# Patient Record
Sex: Female | Born: 1965 | Race: White | Hispanic: No | Marital: Single | Smoking: Current every day smoker
Health system: Southern US, Community
[De-identification: ages and names within clinical notes are randomized; demographics above are authoritative.]

## PROBLEM LIST (undated history)

## (undated) DIAGNOSIS — R000 Tachycardia, unspecified: Secondary | ICD-10-CM

## (undated) DIAGNOSIS — I10 Essential (primary) hypertension: Secondary | ICD-10-CM

## (undated) DIAGNOSIS — G459 Transient cerebral ischemic attack, unspecified: Secondary | ICD-10-CM

## (undated) DIAGNOSIS — F1010 Alcohol abuse, uncomplicated: Secondary | ICD-10-CM

## (undated) DIAGNOSIS — R748 Abnormal levels of other serum enzymes: Secondary | ICD-10-CM

## (undated) DIAGNOSIS — R569 Unspecified convulsions: Secondary | ICD-10-CM

## (undated) DIAGNOSIS — F419 Anxiety disorder, unspecified: Secondary | ICD-10-CM

## (undated) HISTORY — DX: Tachycardia, unspecified: R00.0

## (undated) HISTORY — DX: Abnormal levels of other serum enzymes: R74.8

## (undated) HISTORY — PX: ANTERIOR CRUCIATE LIGAMENT REPAIR: SHX115

---

## 1999-06-12 ENCOUNTER — Other Ambulatory Visit: Admission: RE | Admit: 1999-06-12 | Discharge: 1999-06-12 | Payer: Self-pay | Admitting: *Deleted

## 1999-09-24 ENCOUNTER — Other Ambulatory Visit: Admission: RE | Admit: 1999-09-24 | Discharge: 1999-09-24 | Payer: Self-pay | Admitting: *Deleted

## 2017-02-22 ENCOUNTER — Inpatient Hospital Stay (HOSPITAL_COMMUNITY)
Admission: EM | Admit: 2017-02-22 | Discharge: 2017-02-28 | DRG: 897 | Disposition: A | Discharge status: Home / Self Care | Payer: BLUE CROSS/BLUE SHIELD | Attending: Internal Medicine | Admitting: Internal Medicine

## 2017-02-22 ENCOUNTER — Encounter (HOSPITAL_COMMUNITY): Payer: Self-pay

## 2017-02-22 DIAGNOSIS — Z781 Physical restraint status: Secondary | ICD-10-CM | POA: Diagnosis not present

## 2017-02-22 DIAGNOSIS — F329 Major depressive disorder, single episode, unspecified: Secondary | ICD-10-CM | POA: Diagnosis present

## 2017-02-22 DIAGNOSIS — E876 Hypokalemia: Secondary | ICD-10-CM | POA: Diagnosis present

## 2017-02-22 DIAGNOSIS — Z9181 History of falling: Secondary | ICD-10-CM

## 2017-02-22 DIAGNOSIS — D72819 Decreased white blood cell count, unspecified: Secondary | ICD-10-CM | POA: Diagnosis not present

## 2017-02-22 DIAGNOSIS — G4089 Other seizures: Secondary | ICD-10-CM | POA: Diagnosis present

## 2017-02-22 DIAGNOSIS — R569 Unspecified convulsions: Secondary | ICD-10-CM | POA: Diagnosis not present

## 2017-02-22 DIAGNOSIS — D696 Thrombocytopenia, unspecified: Secondary | ICD-10-CM | POA: Diagnosis present

## 2017-02-22 DIAGNOSIS — Z885 Allergy status to narcotic agent status: Secondary | ICD-10-CM

## 2017-02-22 DIAGNOSIS — N179 Acute kidney failure, unspecified: Secondary | ICD-10-CM | POA: Diagnosis present

## 2017-02-22 DIAGNOSIS — I10 Essential (primary) hypertension: Secondary | ICD-10-CM | POA: Diagnosis present

## 2017-02-22 DIAGNOSIS — F1020 Alcohol dependence, uncomplicated: Secondary | ICD-10-CM | POA: Diagnosis present

## 2017-02-22 DIAGNOSIS — F419 Anxiety disorder, unspecified: Secondary | ICD-10-CM | POA: Diagnosis present

## 2017-02-22 DIAGNOSIS — K7010 Alcoholic hepatitis without ascites: Secondary | ICD-10-CM | POA: Diagnosis not present

## 2017-02-22 DIAGNOSIS — F10939 Alcohol use, unspecified with withdrawal, unspecified: Secondary | ICD-10-CM | POA: Diagnosis present

## 2017-02-22 DIAGNOSIS — F17200 Nicotine dependence, unspecified, uncomplicated: Secondary | ICD-10-CM | POA: Diagnosis present

## 2017-02-22 DIAGNOSIS — F4310 Post-traumatic stress disorder, unspecified: Secondary | ICD-10-CM | POA: Diagnosis present

## 2017-02-22 DIAGNOSIS — F10239 Alcohol dependence with withdrawal, unspecified: Secondary | ICD-10-CM | POA: Diagnosis not present

## 2017-02-22 DIAGNOSIS — F17210 Nicotine dependence, cigarettes, uncomplicated: Secondary | ICD-10-CM | POA: Diagnosis present

## 2017-02-22 HISTORY — DX: Anxiety disorder, unspecified: F41.9

## 2017-02-22 HISTORY — DX: Essential (primary) hypertension: I10

## 2017-02-22 LAB — COMPREHENSIVE METABOLIC PANEL
ALBUMIN: 3.5 g/dL (ref 3.5–5.0)
ALT: 107 U/L — ABNORMAL HIGH (ref 14–54)
ANION GAP: 25 — AB (ref 5–15)
AST: 257 U/L — ABNORMAL HIGH (ref 15–41)
Alkaline Phosphatase: 121 U/L (ref 38–126)
BUN: <5 mg/dL — ABNORMAL LOW (ref 6–20)
CO2: 19 mmol/L — AB (ref 22–32)
Calcium: 10.8 mg/dL — ABNORMAL HIGH (ref 8.9–10.3)
Chloride: 94 mmol/L — ABNORMAL LOW (ref 101–111)
Creatinine, Ser: 1.07 mg/dL — ABNORMAL HIGH (ref 0.44–1.00)
GFR calc Af Amer: >60 mL/min (ref >60)
GFR calc non Af Amer: >60 mL/min (ref >60)
GLUCOSE: 194 mg/dL — AB (ref 65–99)
POTASSIUM: 2.7 mmol/L — AB (ref 3.5–5.1)
SODIUM: 138 mmol/L (ref 135–145)
TOTAL PROTEIN: 6.9 g/dL (ref 6.5–8.1)
Total Bilirubin: 3 mg/dL — ABNORMAL HIGH (ref 0.3–1.2)

## 2017-02-22 LAB — CBC WITH DIFFERENTIAL/PLATELET
BASOS ABS: 0 10*3/uL (ref 0.0–0.1)
Basophils Relative: 1 %
Eosinophils Absolute: 0 10*3/uL (ref 0.0–0.7)
Eosinophils Relative: 0 %
HEMATOCRIT: 45.1 % (ref 36.0–46.0)
Hemoglobin: 15.8 g/dL — ABNORMAL HIGH (ref 12.0–15.0)
LYMPHS PCT: 17 %
Lymphs Abs: 0.7 10*3/uL (ref 0.7–4.0)
MCH: 35.8 pg — ABNORMAL HIGH (ref 26.0–34.0)
MCHC: 35 g/dL (ref 30.0–36.0)
MCV: 102.3 fL — AB (ref 78.0–100.0)
MONO ABS: 0.5 10*3/uL (ref 0.1–1.0)
MONOS PCT: 13 %
NEUTROS ABS: 2.6 10*3/uL (ref 1.7–7.7)
Neutrophils Relative %: 69 %
Platelets: 108 10*3/uL — ABNORMAL LOW (ref 150–400)
RBC: 4.41 MIL/uL (ref 3.87–5.11)
RDW: 16.1 % — AB (ref 11.5–15.5)
WBC: 3.9 10*3/uL — ABNORMAL LOW (ref 4.0–10.5)

## 2017-02-22 LAB — IRON AND TIBC
IRON: 201 ug/dL — AB (ref 28–170)
Saturation Ratios: 71 % — ABNORMAL HIGH (ref 10.4–31.8)
TIBC: 283 ug/dL (ref 250–450)
UIBC: 82 ug/dL

## 2017-02-22 LAB — URINALYSIS, ROUTINE W REFLEX MICROSCOPIC
Glucose, UA: 50 mg/dL — AB
Ketones, ur: NEGATIVE mg/dL
LEUKOCYTES UA: NEGATIVE
Nitrite: NEGATIVE
Protein, ur: 100 mg/dL — AB
RBC / HPF: NONE SEEN RBC/hpf (ref 0–5)
SPECIFIC GRAVITY, URINE: 1.018 (ref 1.005–1.030)
pH: 5 (ref 5.0–8.0)

## 2017-02-22 LAB — RETICULOCYTES
RBC.: 3.85 MIL/uL — ABNORMAL LOW (ref 3.87–5.11)
RETIC COUNT ABSOLUTE: 23.1 10*3/uL (ref 19.0–186.0)
Retic Ct Pct: 0.6 % (ref 0.4–3.1)

## 2017-02-22 LAB — RAPID URINE DRUG SCREEN, HOSP PERFORMED
AMPHETAMINES: NOT DETECTED
BARBITURATES: NOT DETECTED
Benzodiazepines: POSITIVE — AB
COCAINE: NOT DETECTED
Opiates: NOT DETECTED
TETRAHYDROCANNABINOL: NOT DETECTED

## 2017-02-22 LAB — PROTIME-INR
INR: 1.17
Prothrombin Time: 14.9 seconds (ref 11.4–15.2)

## 2017-02-22 LAB — ETHANOL: Alcohol, Ethyl (B): 12 mg/dL — ABNORMAL HIGH (ref <5)

## 2017-02-22 LAB — MAGNESIUM: MAGNESIUM: 1 mg/dL — AB (ref 1.7–2.4)

## 2017-02-22 LAB — FERRITIN: FERRITIN: 2777 ng/mL — AB (ref 11–307)

## 2017-02-22 LAB — VITAMIN B12: VITAMIN B 12: 329 pg/mL (ref 180–914)

## 2017-02-22 MED ORDER — LORAZEPAM 1 MG PO TABS: 1.0000 mg | ORAL_TABLET | Freq: Four times a day (QID) | ORAL | Status: DC | PRN

## 2017-02-22 MED ORDER — ADULT MULTIVITAMIN W/MINERALS CH
1.0000 | ORAL_TABLET | Freq: Every day | ORAL | Status: DC
  Administered 2017-02-23 – 2017-02-28 (×5): 1 via ORAL
  Filled 2017-02-22 (×5): qty 1

## 2017-02-22 MED ORDER — FOLIC ACID 1 MG PO TABS
1.0000 mg | ORAL_TABLET | Freq: Every day | ORAL | Status: DC
  Administered 2017-02-22: 1 mg via ORAL
  Filled 2017-02-22: qty 1

## 2017-02-22 MED ORDER — ONDANSETRON HCL 4 MG/2ML IJ SOLN: 4.0000 mg | Freq: Four times a day (QID) | INTRAMUSCULAR | Status: DC | PRN

## 2017-02-22 MED ORDER — SODIUM CHLORIDE 0.9% FLUSH
3.0000 mL | Freq: Two times a day (BID) | INTRAVENOUS | Status: DC
  Administered 2017-02-22 – 2017-02-28 (×9): 3 mL via INTRAVENOUS

## 2017-02-22 MED ORDER — ENOXAPARIN SODIUM 40 MG/0.4ML ~~LOC~~ SOLN
40.0000 mg | Freq: Every day | SUBCUTANEOUS | Status: DC
  Administered 2017-02-22: 40 mg via SUBCUTANEOUS
  Filled 2017-02-22: qty 0.4

## 2017-02-22 MED ORDER — NICOTINE 7 MG/24HR TD PT24
7.0000 mg | MEDICATED_PATCH | Freq: Every day | TRANSDERMAL | Status: DC
  Administered 2017-02-22: 7 mg via TRANSDERMAL
  Filled 2017-02-22: qty 1

## 2017-02-22 MED ORDER — SODIUM CHLORIDE 0.9 % IV SOLN
30.0000 meq | Freq: Once | INTRAVENOUS | Status: DC
  Filled 2017-02-22: qty 15

## 2017-02-22 MED ORDER — MAGNESIUM SULFATE 2 GM/50ML IV SOLN
2.0000 g | Freq: Once | INTRAVENOUS | Status: AC
  Administered 2017-02-22: 2 g via INTRAVENOUS
  Filled 2017-02-22: qty 50

## 2017-02-22 MED ORDER — FAMOTIDINE IN NACL 20-0.9 MG/50ML-% IV SOLN
20.0000 mg | Freq: Two times a day (BID) | INTRAVENOUS | Status: DC
  Administered 2017-02-22 – 2017-02-23 (×2): 20 mg via INTRAVENOUS
  Filled 2017-02-22 (×2): qty 50

## 2017-02-22 MED ORDER — ADULT MULTIVITAMIN W/MINERALS CH
1.0000 | ORAL_TABLET | Freq: Every day | ORAL | Status: DC
  Administered 2017-02-22: 1 via ORAL
  Filled 2017-02-22: qty 1

## 2017-02-22 MED ORDER — FOLIC ACID 1 MG PO TABS
1.0000 mg | ORAL_TABLET | Freq: Every day | ORAL | Status: DC
  Administered 2017-02-23 – 2017-02-28 (×5): 1 mg via ORAL
  Filled 2017-02-22 (×7): qty 1

## 2017-02-22 MED ORDER — THIAMINE HCL 100 MG/ML IJ SOLN: 100.0000 mg | Freq: Every day | INTRAMUSCULAR | Status: DC

## 2017-02-22 MED ORDER — NICOTINE 7 MG/24HR TD PT24
7.0000 mg | MEDICATED_PATCH | Freq: Every day | TRANSDERMAL | Status: DC
  Administered 2017-02-22 – 2017-02-23 (×2): 7 mg via TRANSDERMAL
  Filled 2017-02-22: qty 1

## 2017-02-22 MED ORDER — LORAZEPAM 2 MG/ML IJ SOLN
2.0000 mg | INTRAMUSCULAR | Status: DC | PRN
Start: 2017-02-22 — End: 2017-02-28
  Administered 2017-02-22 – 2017-02-23 (×2): 2 mg via INTRAVENOUS
  Administered 2017-02-23: 3 mg via INTRAVENOUS
  Administered 2017-02-23 – 2017-02-24 (×5): 2 mg via INTRAVENOUS
  Administered 2017-02-24 (×2): 3 mg via INTRAVENOUS
  Administered 2017-02-24 – 2017-02-25 (×2): 2 mg via INTRAVENOUS
  Filled 2017-02-22 (×5): qty 1
  Filled 2017-02-22: qty 2
  Filled 2017-02-22 (×8): qty 1
  Filled 2017-02-22: qty 2
  Filled 2017-02-22: qty 1

## 2017-02-22 MED ORDER — VITAMIN B-1 100 MG PO TABS
100.0000 mg | ORAL_TABLET | Freq: Every day | ORAL | Status: DC
  Administered 2017-02-22: 100 mg via ORAL
  Filled 2017-02-22: qty 1

## 2017-02-22 MED ORDER — THIAMINE HCL 100 MG/ML IJ SOLN
Freq: Once | INTRAVENOUS | Status: AC
  Administered 2017-02-22: 20:00:00 via INTRAVENOUS
  Filled 2017-02-22: qty 1000

## 2017-02-22 MED ORDER — MAGNESIUM SULFATE 2 GM/50ML IV SOLN
2.0000 g | Freq: Once | INTRAVENOUS | Status: AC
  Administered 2017-02-23: 2 g via INTRAVENOUS
  Filled 2017-02-22: qty 50

## 2017-02-22 MED ORDER — SODIUM CHLORIDE 0.9 % IV SOLN
INTRAVENOUS | Status: DC
  Administered 2017-02-23: 04:00:00 via INTRAVENOUS

## 2017-02-22 MED ORDER — SODIUM CHLORIDE 0.9 % IV BOLUS (SEPSIS)
500.0000 mL | Freq: Once | INTRAVENOUS | Status: AC
  Administered 2017-02-22: 500 mL via INTRAVENOUS

## 2017-02-22 MED ORDER — LORAZEPAM 2 MG/ML IJ SOLN: 1.0000 mg | Freq: Four times a day (QID) | INTRAMUSCULAR | Status: DC | PRN

## 2017-02-22 MED ORDER — LORAZEPAM 2 MG/ML IJ SOLN
1.0000 mg | Freq: Once | INTRAMUSCULAR | Status: AC
  Administered 2017-02-22: 1 mg via INTRAVENOUS
  Filled 2017-02-22: qty 1

## 2017-02-22 MED ORDER — POTASSIUM CHLORIDE CRYS ER 20 MEQ PO TBCR
20.0000 meq | EXTENDED_RELEASE_TABLET | Freq: Once | ORAL | Status: AC
  Administered 2017-02-22: 20 meq via ORAL
  Filled 2017-02-22: qty 1

## 2017-02-22 MED ORDER — BISACODYL 5 MG PO TBEC: 5.0000 mg | DELAYED_RELEASE_TABLET | Freq: Every day | ORAL | Status: DC | PRN

## 2017-02-22 MED ORDER — POLYETHYLENE GLYCOL 3350 17 G PO PACK: 17.0000 g | PACK | Freq: Every day | ORAL | Status: DC | PRN

## 2017-02-22 NOTE — ED Notes (Signed)
Attempted report at this time.  Nurse to call back when available. 

## 2017-02-22 NOTE — ED Provider Notes (Signed)
[facility] DEPT Provider Note   CSN: [id number] Arrival date & time: 02/22/17  1553     History   Chief Complaint Chief Complaint  Patient presents with  . Seizures    HPI Cynthia Schroeder is a 51 y.o. female.  HPI   She presents for evaluation of a seizure.  She reports having a seizure in the remote past but is not currently taking medications for seizure disorder.  She recently moved to [facility] to be with her mother who had surgery.  She also went through rehab for alcohol abuse, a 28 day program, 3 months ago.  Since discharge from the rehab, 2 months ago she has been drinking daily "2 or 3 airplane bottles".  In the last several days she has had some nausea and vomiting and decreased ability to eat and drink.  Her mother thought that she had the flu.  The patient was shopping in a grocery store today, when workers there saw her have a "seizure".  They called EMS.  EMS found the patient to be alert, but confused, then after about 5 minutes she became alert and oriented 3.  She was then transported to the hospital and reportedly had a "grand mal seizure" with subsequent postictal state, during the transport.  Duration of the seizure is unknown.  On arrival the patient is alert, calm and comfortable has no complaints.  She denies headache neck pain back pain nausea vomiting weakness or dizziness.  Her mother is here now with her.  The patient had been shopping alone.  There have been no recent illnesses such as fever, cough, chest pain, change in bowel or urinary habits.  There are no other known modifying factors.  Past Medical History:  Diagnosis Date  . Anxiety   . Hypertension     Patient Active Problem List   Diagnosis Date Noted  . Seizure ([place]) 02/22/2017  . Anxiety 02/22/2017  . Hypertension 02/22/2017  . Alcohol dependence ([place]) 02/22/2017  . Hypokalemia 02/22/2017  . Alcoholic hepatitis [id number]  . Leukopenia 02/22/2017  . Thrombocytopenia ([place]) 02/22/2017      Past Surgical History:  Procedure Laterality Date  . ANTERIOR CRUCIATE LIGAMENT REPAIR Right     OB History    No data available       Home Medications    Prior to Admission medications   Not on File    Family History History reviewed. No pertinent family history.  Social History Social History  Substance Use Topics  . Smoking status: Current Every Day Smoker    Packs/day: 0.50    Years: 32.00    Types: Cigarettes  . Smokeless tobacco: Never Used  . Alcohol use 3.6 oz/week    6 Shots of liquor per week     Allergies   Codeine   Review of Systems Review of Systems  All other systems reviewed and are negative.    Physical Exam Updated Vital Signs BP (!) 126/101   Pulse (!) 121   Temp 98.1 F (36.7 C)   Resp 17   Ht 5\' 6"  (1.676 m)   Wt 170 lb (77.1 kg)   LMP  (LMP Unknown) Comment: IUD  SpO2 95%   BMI 27.44 kg/m   Physical Exam  Constitutional: She is oriented to person, place, and time. She appears well-developed and well-nourished. No distress.  HENT:  Head: Normocephalic and atraumatic.  Small abrasion left anterior tongue, not bleeding.  Eyes: Conjunctivae and EOM are normal. Pupils are equal, round,  and reactive to light. Scleral icterus (Mild) is present.  Neck: Normal range of motion and phonation normal. Neck supple.  Cardiovascular: Normal rate and regular rhythm.   Pulmonary/Chest: Effort normal and breath sounds normal. She exhibits no tenderness.  Abdominal: Soft. She exhibits no distension and no mass. There is no tenderness. There is no guarding.  Musculoskeletal: Normal range of motion. She exhibits no edema.  Neurological: She is alert and oriented to person, place, and time. She exhibits normal muscle tone.  Mild dysarthria.  No aphasia or nystagmus.  Normal strength and sensation bilaterally.  No asterixis.  She does have a very mild tremor.  Skin: Skin is warm and dry.  Psychiatric: She has a normal mood and affect. Her  behavior is normal. Judgment and thought content normal.  Nursing note and vitals reviewed.    ED Treatments / Results  Labs (all labs ordered are listed, but only abnormal results are displayed) Labs Reviewed  COMPREHENSIVE METABOLIC PANEL - Abnormal; Notable for the following:       Result Value   Potassium 2.7 (*)    Chloride 94 (*)    CO2 19 (*)    Glucose, Bld 194 (*)    BUN <5 (*)    Creatinine, Ser 1.07 (*)    Calcium 10.8 (*)    AST 257 (*)    ALT 107 (*)    Total Bilirubin 3.0 (*)    Anion gap 25 (*)    All other components within normal limits  CBC WITH DIFFERENTIAL/PLATELET - Abnormal; Notable for the following:    WBC 3.9 (*)    Hemoglobin 15.8 (*)    MCV 102.3 (*)    MCH 35.8 (*)    RDW 16.1 (*)    Platelets 108 (*)    All other components within normal limits  ETHANOL - Abnormal; Notable for the following:    Alcohol, Ethyl (B) 12 (*)    All other components within normal limits  RAPID URINE DRUG SCREEN, HOSP PERFORMED - Abnormal; Notable for the following:    Benzodiazepines POSITIVE (*)    All other components within normal limits  URINALYSIS, ROUTINE W REFLEX MICROSCOPIC - Abnormal; Notable for the following:    Color, Urine AMBER (*)    APPearance CLOUDY (*)    Glucose, UA 50 (*)    Hgb urine dipstick MODERATE (*)    Bilirubin Urine SMALL (*)    Protein, ur 100 (*)    Bacteria, UA RARE (*)    Squamous Epithelial / LPF 6-30 (*)    All other components within normal limits  MAGNESIUM  PROTIME-INR  HEPATITIS PANEL, ACUTE    EKG  EKG Interpretation  Date/Time:  Saturday February 22 2017 19:21:58 EST Ventricular Rate:  120 PR Interval:    QRS Duration: 83 QT Interval:  323 QTC Calculation: 457 R Axis:   82 Text Interpretation:  Sinus tachycardia Consider right atrial enlargement Low voltage, precordial leads Borderline T abnormalities, anterior leads No old tracing to compare Confirmed by [facility]  MD, Kendale Rembold [id number]) on 02/22/2017 7:26:47 PM        Radiology No results found.  Procedures Procedures (including critical care time)  Medications Ordered in ED Medications  potassium chloride 30 mEq in sodium chloride 0.9 % 265 mL (KCL MULTIRUN) IVPB (30 mEq Intravenous Given 02/22/17 1746)  LORazepam (ATIVAN) tablet 1 mg (not administered)    Or  LORazepam (ATIVAN) injection 1 mg (not administered)  ondansetron (ZOFRAN) injection 4 mg (  not administered)  sodium chloride 0.9 % 1,000 mL with thiamine 123XX123 mg, folic acid 1 mg, multivitamins adult 10 mL infusion (not administered)  magnesium sulfate IVPB 2 g 50 mL (not administered)  famotidine (PEPCID) IVPB 20 mg premix (not administered)  potassium chloride SA (K-DUR,KLOR-CON) CR tablet 20 mEq (not administered)  sodium chloride 0.9 % bolus 500 mL (0 mLs Intravenous Stopped 02/22/17 1849)  LORazepam (ATIVAN) injection 1 mg (1 mg Intravenous Given 02/22/17 1846)     Initial Impression / Assessment and Plan / ED Course  I have reviewed the triage vital signs and the nursing notes.  Pertinent labs & imaging results that were available during my care of the patient were reviewed by me and considered in my medical decision making (see chart for details).  Clinical Course as of Feb 22 1926  Sat Feb 22, 2017  [id number] He has become more tremulous now and is unable to hold a cup to drink.  This appears to be progression of alcohol withdrawal symptoms.  No seizure in ED.  She remains lucid.  [EW]    Clinical Course User Index [EW] Daleen Bo, MD    Medications  potassium chloride 30 mEq in sodium chloride 0.9 % 265 mL (KCL MULTIRUN) IVPB (30 mEq Intravenous Given 02/22/17 1746)  LORazepam (ATIVAN) tablet 1 mg (not administered)    Or  LORazepam (ATIVAN) injection 1 mg (not administered)  ondansetron (ZOFRAN) injection 4 mg (not administered)  sodium chloride 0.9 % 1,000 mL with thiamine 123XX123 mg, folic acid 1 mg, multivitamins adult 10 mL infusion (not administered)  magnesium sulfate IVPB  2 g 50 mL (not administered)  famotidine (PEPCID) IVPB 20 mg premix (not administered)  potassium chloride SA (K-DUR,KLOR-CON) CR tablet 20 mEq (not administered)  sodium chloride 0.9 % bolus 500 mL (0 mLs Intravenous Stopped 02/22/17 1849)  LORazepam (ATIVAN) injection 1 mg (1 mg Intravenous Given 02/22/17 1846)    Patient Vitals for the past 24 hrs:  BP Temp Pulse Resp SpO2 Height Weight  02/22/17 1836 (!) 126/101 - (!) 121 - - - -  02/22/17 1830 (!) 126/101 - (!) 125 17 95 % - -  02/22/17 1815 130/98 - (!) 135 22 96 % - -  02/22/17 1800 137/99 - (!) 134 19 95 % - -  02/22/17 1745 131/96 - (!) 132 15 98 % - -  02/22/17 1730 125/96 - (!) 122 18 99 % - -  02/22/17 1715 133/97 - 120 15 97 % - -  02/22/17 1700 130/89 - (!) 126 21 97 % - -  02/22/17 1645 126/89 - (!) 130 21 97 % - -  02/22/17 1630 114/94 - (!) 135 19 96 % - -  02/22/17 1615 133/97 - (!) 131 14 98 % - -  02/22/17 1609 - - - - - 5\' 6"  (1.676 m) 170 lb (77.1 kg)  02/22/17 1608 136/99 98.1 F (36.7 C) (!) 134 16 97 % - -  02/22/17 1603 - - - - 95 % - -    6:46 PM Reevaluation with update and discussion. After initial assessment and treatment, an updated evaluation reveals she is now alert and oriented 3.  She is somewhat more tremulous than earlier.  She now admits to drinking 7-8 "airplane bottles" of alcohol each day.  She states her last drink was this morning.  Findings discussed with the patient and all questions were answered. Cardelia Sassano L   6:53 PM-Consult complete with hospitalist. Patient case explained  and discussed.  He agrees to admit patient for further evaluation and treatment. Call ended at 9: 02  CRITICAL CARE Performed by: Daleen Bo L Total critical care time: 40 minutes Critical care time was exclusive of separately billable procedures and treating other patients. Critical care was necessary to treat or prevent imminent or life-threatening deterioration. Critical care was time spent personally by me  on the following activities: development of treatment plan with patient and/or surrogate as well as nursing, discussions with consultants, evaluation of patient's response to treatment, examination of patient, obtaining history from patient or surrogate, ordering and performing treatments and interventions, ordering and review of laboratory studies, ordering and review of radiographic studies, pulse oximetry and re-evaluation of patient's condition.  Final Clinical Impressions(s) / ED Diagnoses   Final diagnoses:  Seizure ([place])  Alcohol withdrawal syndrome with complication ([place])    Reported seizure, with alcoholism, and current normal alcohol level.  Clear alcohol withdrawal syndrome with possible impending delirium tremens, however she has normal mentation, currently.  Benzodiazepine given in the ED, she will require admission for observation.  She remains tachycardic despite IV fluid boluses.  She does not have known epilepsy.  There is no indication for initiation of antiepileptics at this time.  No significant head trauma or concern for intracranial injury.   Nursing Notes Reviewed/ Care Coordinated Applicable Imaging Reviewed Interpretation of Laboratory Data incorporated into ED treatment   Plan: Admit  New Prescriptions New Prescriptions   No medications on file     Daleen Bo, MD 02/22/17 1927

## 2017-02-22 NOTE — ED Triage Notes (Signed)
Pt brought in by [facility]. Pt was at [facility] and EMS was called for seizure activity. Pt was alert at scene. When moved to truck pt was reported to have grand mal seizure. 2.5 Versed given on truck. Pt is only Alert to self and place. Pt reports drinking 3 airplane bottles of vodka today.

## 2017-02-22 NOTE — H&P (Signed)
History and Physical    Cynthia Schroeder [id number] DOB: 1966-03-13 DOA: 02/22/2017  PCP: No PCP Per Patient   Patient coming from: Home  Chief Complaint: Tremors, seizure-like activity   HPI: Cynthia Schroeder is a 51 y.o. female with medical history significant for anxiety, hypertension, and alcohol abuse, recently completing an inpatient treatment program, but now relapsed and presenting to the emergency department after seizure-like activity at a grocery store. Patient reports completing a 28 day inpatient stay for alcohol recovery, but began to drink again shortly after her discharge approximately 2 weeks ago. Since that time, she has been drinking daily, initially reporting her consumption to be 2-3 drinks per day, but later clarified that it has been 7-8. She reports having an episode at home several days ago where she experienced transient loss of awareness, waking up on the floor without any significant injury, but without recollection of what had transpired. She reports waking this morning with tremors which improved after a drink of vodka. She then went to the grocery store where she was noted by bystanders to exhibit generalized seizure-like activity. EMS was called and witnessed another episode that they described as a tonic-clonic seizure. She was given 2.5 mg Versed en route to the hospital. She denies any use of other substances, denies any intentional harm herself, and denies headache, change in vision or hearing, or focal numbness or weakness. There's been no recent fevers or chills and no chest pain or palpitations. Patient denies dyspnea or cough.   ED Course: Upon arrival to the ED, patient is found to be afebrile, saturating well on room air, tachycardic 130s, and with vitals otherwise stable. Patient was alert and oriented, slightly groggy. EKG features a sinus tachycardia with rate 120. Chemistry panel is notable for potassium of 2.7, bicarbonate 19, anion gap 25, AST 257, ALT  107, and total bilirubin 3.0. There is no prior chemistry panel for comparison. Ethanol level is 12 and CBC features a leukopenia to 3900 and thrombocytopenia with platelets 108,000. MCV is 102.3. Urine appears contaminated but is not consistent with infection. UDS is positive for benzodiazepines only which were administered en route to the hospital. Patient was given a 500 mL normal saline bolus, 30 mEq IV potassium, and Ativan per CIWA. Tachycardia has improved some with Ativan and blood pressure remained stable. Patient is in no apparent respiratory distress and there is been no further convulsive activity in the emergency department. She will be admitted to the stepdown unit for ongoing evaluation and management of a first seizure, suspected to be alcohol-related, as well as suspected acute alcoholic hepatitis.  Review of Systems:  All other systems reviewed and apart from HPI, are negative.  Past Medical History:  Diagnosis Date  . Anxiety   . Hypertension     Past Surgical History:  Procedure Laterality Date  . ANTERIOR CRUCIATE LIGAMENT REPAIR Right      reports that she has been smoking Cigarettes.  She has a 16.00 pack-year smoking history. She has never used smokeless tobacco. She reports that she drinks about 3.6 oz of alcohol per week . She reports that she does not use drugs.  Allergies  Allergen Reactions  . Codeine     Itching     History reviewed. No pertinent family history.   Prior to Admission medications   Not on File    Physical Exam: Vitals:   02/22/17 1945 02/22/17 2000 02/22/17 2015 02/22/17 2027  BP: 136/87 121/83 135/98   Pulse: Marland Kitchen)  125 (!) 122 (!) 121   Resp: 15 23 18    Temp:    97.8 F (36.6 C)  SpO2: 98% 96% 96%   Weight:      Height:          Constitutional: No acute distress, anxious, no pallor or diaphoresis Eyes: PERTLA, lids and conjunctivae normal ENMT: Mucous membranes are moist. Posterior pharynx clear of any exudate or lesions.     Neck: normal, supple, no masses, no thyromegaly Respiratory: clear to auscultation bilaterally, no wheezing, no crackles. Normal respiratory effort.    Cardiovascular: Rate ~120 and regular. No extremity edema. No significant JVD. Abdomen: No distension, no tenderness, no masses palpated. Bowel sounds normal.  Musculoskeletal: no clubbing / cyanosis. No joint deformity upper and lower extremities. Normal muscle tone.  Skin: no significant rashes, lesions, ulcers. Warm, dry, well-perfused.  Neurologic: CN 2-12 grossly intact. Sensation intact, DTR normal. Strength 5/5 in all 4 limbs.  Psychiatric: Alert and oriented x 3. Anxious, fine resting tremor of b/l UE's. Cooperative.     Labs on Admission: I have personally reviewed following labs and imaging studies  CBC:  Recent Labs Lab 02/22/17 1626  WBC 3.9*  NEUTROABS 2.6  HGB 15.8*  HCT 45.1  MCV 102.3*  PLT 123XX123*   Basic Metabolic Panel:  Recent Labs Lab 02/22/17 1626  NA 138  K 2.7*  CL 94*  CO2 19*  GLUCOSE 194*  BUN <5*  CREATININE 1.07*  CALCIUM 10.8*   GFR: Estimated Creatinine Clearance: 67.4 mL/min (by C-G formula based on SCr of 1.07 mg/dL (H)). Liver Function Tests:  Recent Labs Lab 02/22/17 1626  AST 257*  ALT 107*  ALKPHOS 121  BILITOT 3.0*  PROT 6.9  ALBUMIN 3.5   No results for input(s): LIPASE, AMYLASE in the last 168 hours. No results for input(s): AMMONIA in the last 168 hours. Coagulation Profile: No results for input(s): INR, PROTIME in the last 168 hours. Cardiac Enzymes: No results for input(s): CKTOTAL, CKMB, CKMBINDEX, TROPONINI in the last 168 hours. BNP (last 3 results) No results for input(s): PROBNP in the last 8760 hours. HbA1C: No results for input(s): HGBA1C in the last 72 hours. CBG: No results for input(s): GLUCAP in the last 168 hours. Lipid Profile: No results for input(s): CHOL, HDL, LDLCALC, TRIG, CHOLHDL, LDLDIRECT in the last 72 hours. Thyroid Function Tests: No  results for input(s): TSH, T4TOTAL, FREET4, T3FREE, THYROIDAB in the last 72 hours. Anemia Panel: No results for input(s): VITAMINB12, FOLATE, FERRITIN, TIBC, IRON, RETICCTPCT in the last 72 hours. Urine analysis:    Component Value Date/Time   COLORURINE AMBER (A) 02/22/2017 1645   APPEARANCEUR CLOUDY (A) 02/22/2017 1645   LABSPEC 1.018 02/22/2017 1645   PHURINE 5.0 02/22/2017 1645   GLUCOSEU 50 (A) 02/22/2017 1645   HGBUR MODERATE (A) 02/22/2017 1645   BILIRUBINUR SMALL (A) 02/22/2017 1645   KETONESUR NEGATIVE 02/22/2017 1645   PROTEINUR 100 (A) 02/22/2017 1645   NITRITE NEGATIVE 02/22/2017 1645   LEUKOCYTESUR NEGATIVE 02/22/2017 1645   Sepsis Labs: @LABRCNTIP (procalcitonin:4,lacticidven:4) )No results found for this or any previous visit (from the past 240 hour(s)).   Radiological Exams on Admission: No results found.  EKG: Independently reviewed. Sinus tachycardia (rate 120)   Assessment/Plan  1. Seizure, first  - Pt presents following an episode of generalized convulsions in a grocery store; had a second episode after EMS arrived and was ended with 2.5 mg Versed; she was briefly postictal  - She has been drinking 7-8  alcoholic beverages/day, woke with severe tremors on morning of admit, treated them successfully with a shot of vodka before going to the store where she had apparent seizure  - She is completely A&O on arrival and there are no focal deficits; she is noted to be a little sweating and tremulous and was treated with Ativan in ED  - Likely alcohol withdrawal seizure; treating withdrawal as below  - Correct hypomagnesemia, manage withdrawal, check EEG and MRI    2. Alcohol dependence with complicated withdrawal  - Pt reports long-history of alcohol dependence and completed a 28-day inpatient recovery program recently  - She presents following a seizure and appears to be withdrawing in ED  - She will be monitored with CIWA and treated with Ativan  - Supplement  vitamins and minerals - Social work consultation requested    3. Hypokalemia, hypomagnesemia  - Serum potassium is 2.7 in ED and she was treated with 30 mEq IV, and 20 mEq oral potassium  - She was given 2 g IV magnesium empirically; mag level returned low at 1.0 and another 2 g magnesium was administered  - Monitor on telemetry and repeat chemistries in am -   4. Leukopenia, thrombocytopenia  - WBC is 3,900 on admission and platelets are 108,000  - No fever or apparent focus of infection; no bleeding  - Likely secondary to alcohol abuse; will check B12 and folate - Culture if febrile   5. Acute alcoholic hepatitis - Pt presents with AST 257, ALT 107, t bili 3.0, INR 1.17 - Suspected secondary to alcohol abuse; no prior labs available  - Check viral hepatitis panel  - Maddrey DF score 11.7 on admission, suggesting good prognosis  - Continue IV hydration, vitamins, minerals, acid-suppression, monitor lytes    DVT prophylaxis: sq Lovenox  Code Status: Full  Family Communication: Discussed with patient Disposition Plan: Admit to stepdown Consults called: None Admission status: Inpatient    Vianne Bulls, MD [facility] Pager [phone]  If 7PM-7AM, please contact night-coverage [email or web address] Password [facility]  02/22/2017, 8:48 PM

## 2017-02-23 ENCOUNTER — Inpatient Hospital Stay (HOSPITAL_COMMUNITY): Payer: BLUE CROSS/BLUE SHIELD

## 2017-02-23 LAB — CBC WITH DIFFERENTIAL/PLATELET
BASOS ABS: 0 10*3/uL (ref 0.0–0.1)
BASOS PCT: 0 %
Eosinophils Absolute: 0 10*3/uL (ref 0.0–0.7)
Eosinophils Relative: 1 %
HEMATOCRIT: 37.6 % (ref 36.0–46.0)
HEMOGLOBIN: 13 g/dL (ref 12.0–15.0)
LYMPHS PCT: 27 %
Lymphs Abs: 1.2 10*3/uL (ref 0.7–4.0)
MCH: 35.9 pg — ABNORMAL HIGH (ref 26.0–34.0)
MCHC: 34.6 g/dL (ref 30.0–36.0)
MCV: 103.9 fL — AB (ref 78.0–100.0)
MONO ABS: 0.6 10*3/uL (ref 0.1–1.0)
MONOS PCT: 13 %
NEUTROS ABS: 2.6 10*3/uL (ref 1.7–7.7)
NEUTROS PCT: 60 %
Platelets: 91 10*3/uL — ABNORMAL LOW (ref 150–400)
RBC: 3.62 MIL/uL — ABNORMAL LOW (ref 3.87–5.11)
RDW: 16.5 % — AB (ref 11.5–15.5)
WBC: 4.4 10*3/uL (ref 4.0–10.5)

## 2017-02-23 LAB — COMPREHENSIVE METABOLIC PANEL
ALBUMIN: 2.9 g/dL — AB (ref 3.5–5.0)
ALT: 88 U/L — ABNORMAL HIGH (ref 14–54)
ANION GAP: 10 (ref 5–15)
AST: 212 U/L — ABNORMAL HIGH (ref 15–41)
Alkaline Phosphatase: 105 U/L (ref 38–126)
BUN: <5 mg/dL — ABNORMAL LOW (ref 6–20)
CO2: 26 mmol/L (ref 22–32)
Calcium: 9.3 mg/dL (ref 8.9–10.3)
Chloride: 102 mmol/L (ref 101–111)
Creatinine, Ser: 0.8 mg/dL (ref 0.44–1.00)
GFR calc non Af Amer: >60 mL/min (ref >60)
GLUCOSE: 100 mg/dL — AB (ref 65–99)
POTASSIUM: 2.5 mmol/L — AB (ref 3.5–5.1)
SODIUM: 138 mmol/L (ref 135–145)
TOTAL PROTEIN: 5.8 g/dL — AB (ref 6.5–8.1)
Total Bilirubin: 3.8 mg/dL — ABNORMAL HIGH (ref 0.3–1.2)

## 2017-02-23 LAB — GLUCOSE, CAPILLARY: GLUCOSE-CAPILLARY: 85 mg/dL (ref 65–99)

## 2017-02-23 LAB — HIV ANTIBODY (ROUTINE TESTING W REFLEX): HIV SCREEN 4TH GENERATION: NONREACTIVE

## 2017-02-23 LAB — FOLATE: Folate: >100 ng/mL (ref >5.9)

## 2017-02-23 LAB — MRSA PCR SCREENING: MRSA BY PCR: NEGATIVE

## 2017-02-23 LAB — MAGNESIUM: MAGNESIUM: 2.1 mg/dL (ref 1.7–2.4)

## 2017-02-23 MED ORDER — HALOPERIDOL LACTATE 5 MG/ML IJ SOLN
2.0000 mg | Freq: Four times a day (QID) | INTRAMUSCULAR | Status: DC | PRN
  Administered 2017-02-23: 5 mg via INTRAVENOUS
  Filled 2017-02-23: qty 1

## 2017-02-23 MED ORDER — NICOTINE 14 MG/24HR TD PT24
14.0000 mg | MEDICATED_PATCH | Freq: Every day | TRANSDERMAL | Status: DC
Start: 2017-02-24 — End: 2017-02-28
  Administered 2017-02-24 – 2017-02-28 (×5): 14 mg via TRANSDERMAL
  Filled 2017-02-23 (×5): qty 1

## 2017-02-23 MED ORDER — LORAZEPAM 2 MG/ML IJ SOLN
2.0000 mg | Freq: Once | INTRAMUSCULAR | Status: AC
  Administered 2017-02-23: 2 mg via INTRAVENOUS

## 2017-02-23 MED ORDER — HALOPERIDOL LACTATE 5 MG/ML IJ SOLN
5.0000 mg | Freq: Four times a day (QID) | INTRAMUSCULAR | Status: DC | PRN
  Administered 2017-02-23 – 2017-02-24 (×2): 5 mg via INTRAVENOUS
  Filled 2017-02-23 (×3): qty 1

## 2017-02-23 MED ORDER — POTASSIUM CHLORIDE CRYS ER 20 MEQ PO TBCR
40.0000 meq | EXTENDED_RELEASE_TABLET | ORAL | Status: AC
  Administered 2017-02-23 (×2): 40 meq via ORAL
  Filled 2017-02-23 (×2): qty 2

## 2017-02-23 MED ORDER — LORAZEPAM 2 MG/ML IJ SOLN
2.0000 mg | Freq: Four times a day (QID) | INTRAMUSCULAR | Status: DC
  Administered 2017-02-23 – 2017-02-24 (×5): 2 mg via INTRAVENOUS
  Filled 2017-02-23 (×7): qty 1

## 2017-02-23 MED ORDER — CLONIDINE HCL 0.1 MG PO TABS
0.1000 mg | ORAL_TABLET | Freq: Three times a day (TID) | ORAL | Status: DC
  Administered 2017-02-23 (×2): 0.1 mg via ORAL
  Filled 2017-02-23 (×3): qty 1

## 2017-02-23 MED ORDER — LORAZEPAM 2 MG/ML IJ SOLN
4.0000 mg | Freq: Once | INTRAMUSCULAR | Status: AC
  Administered 2017-02-23: 4 mg via INTRAVENOUS

## 2017-02-23 NOTE — Progress Notes (Signed)
[place] [facility] TEAM 1 - Stepdown/ICU TEAM  Cynthia Schroeder  [id number] DOB: 11/03/1966 DOA: 02/22/2017 PCP: No PCP Per Patient    Brief Narrative:  51 y.o. female with history of anxiety, hypertension, and alcohol abuse who presented to the ED after seizure-like activity at a grocery store. Patient reports 7-8 drinks per day. She reports waking from sleep with tremors which improve after a drink of vodka. She went to the grocery store where she was noted by bystanders to exhibit generalized seizure-like activity. EMS was called and witnessed another episode that they described as a tonic-clonic seizure. She was given 2.5 mg Versed en route to the hospital.   Subjective: The patient is alert but confused.  She tells me she is awake meds.  She speaks about "those dangerous people over there" when pointing to the window.  She is wanting to get up and walk around and to take a shower and also to go outside to smoke.  She is able to answer some questions appropriately but other questions she is not able to answer appropriately and she is clearly not fully oriented.  Assessment & Plan:  Seizure MRI without acute findings - most consistent with alcohol withdrawal seizure - canceled EEG as it would not be able to be accomplished at present and is likely not needed - treat alcohol withdrawal  EtOH dependence w/ withdrawal  Withdrawal worsening with agitated and impulsive behavior - added scheduled Ativan to see while Ativan - add Haldol when necessary - spoke with patient at length  Severe Hypokalemia Supplement and follow  Hypomagnesemia Corrected with supplementation  Thrombocytopenia Due to alcoholism  Alcoholic hepatitis  Maddrey DF score 11.7 on admission  DVT prophylaxis: SCDs only as plt < 100 and pt high fall risk  Code Status: FULL CODE Family Communication: no family present at time of exam  Disposition Plan: SDU  Consultants:  None  Procedures: None  Antimicrobials:   None  Objective: Blood pressure (!) 128/112, pulse (!) 105, temperature 99 F (37.2 C), temperature source Oral, resp. rate (!) 26, height 5\' 6"  (1.676 m), weight 77.1 kg (170 lb), SpO2 98 %.  Intake/Output Summary (Last 24 hours) at 02/23/17 1603 Last data filed at 02/23/17 1300  Gross per 24 hour  Intake          1588.33 ml  Output              700 ml  Net           888.33 ml   Filed Weights   02/22/17 1609  Weight: 77.1 kg (170 lb)    Examination: General: No acute respiratory distress - alert but agitated Lungs: Clear to auscultation bilaterally without wheezes or crackles Cardiovascular: Regular rate and rhythm without murmur gallop or rub normal S1 and S2 Abdomen: Nontender, nondistended, soft, bowel sounds positive, no rebound, no ascites, no appreciable mass Extremities: No significant cyanosis, clubbing, or edema bilateral lower extremities  CBC:  Recent Labs Lab 02/22/17 1626 02/23/17 0231  WBC 3.9* 4.4  NEUTROABS 2.6 2.6  HGB 15.8* 13.0  HCT 45.1 37.6  MCV 102.3* 103.9*  PLT 108* 91*   Basic Metabolic Panel:  Recent Labs Lab 02/22/17 1626 02/22/17 1947 02/23/17 0231  NA 138  --  138  K 2.7*  --  2.5*  CL 94*  --  102  CO2 19*  --  26  GLUCOSE 194*  --  100*  BUN <5*  --  <5*  CREATININE 1.07*  --  0.80  CALCIUM 10.8*  --  9.3  MG  --  1.0* 2.1   GFR: Estimated Creatinine Clearance: 90.1 mL/min (by C-G formula based on SCr of 0.8 mg/dL).  Liver Function Tests:  Recent Labs Lab 02/22/17 1626 02/23/17 0231  AST 257* 212*  ALT 107* 88*  ALKPHOS 121 105  BILITOT 3.0* 3.8*  PROT 6.9 5.8*  ALBUMIN 3.5 2.9*    Coagulation Profile:  Recent Labs Lab 02/22/17 1947  INR 1.17    CBG:  Recent Labs Lab 02/23/17 0848  GLUCAP 85    Recent Results (from the past 240 hour(s))  MRSA PCR Screening     Status: None   Collection Time: 02/23/17  2:20 AM  Result Value Ref Range Status   MRSA by PCR NEGATIVE NEGATIVE Final    Comment:         The GeneXpert MRSA Assay (FDA approved for NASAL specimens only), is one component of a comprehensive MRSA colonization surveillance program. It is not intended to diagnose MRSA infection nor to guide or monitor treatment for MRSA infections.      Scheduled Meds: . enoxaparin (LOVENOX) injection  40 mg Subcutaneous QHS  . famotidine (PEPCID) IV  20 mg Intravenous Q12H  . folic acid  1 mg Oral Daily  . multivitamin with minerals  1 tablet Oral Daily  . nicotine  7 mg Transdermal Daily  . potassium chloride (KCL MULTIRUN) 30 mEq in 265 mL IVPB  30 mEq Intravenous Once  . sodium chloride flush  3 mL Intravenous Q12H   Continuous Infusions: . sodium chloride 100 mL/hr at 02/23/17 0800     LOS: 1 day   Cherene Altes, MD [facility] Office  [phone] Pager - Text Page per Shea Evans as per below:  On-Call/Text Page:      [email or web address]      password [email or web address]  If 7PM-7AM, please contact night-coverage [email or web address] Password [email or web address] 02/23/2017, 4:03 PM

## 2017-02-23 NOTE — Progress Notes (Addendum)
RN alerted to Patient bed alarm.Patient keeps getting out of bed throughout the day and remains impulsive even with PRN Ativan (4mg ). Upon trying to get her back to bed, Dr Thereasa Solo arrived and witnessed her impulsiveness. New orders provided.  Patient again got up and was disoriented to situation. Provided PRN Haldol 5mg  at that time. With little improvement. Patient then removed all medical equipment and gown and was walking around in her room upper half exposed. She did have her sweat pants on.   At this time Dr. Thereasa Solo was paged again and arrived at bedside. She was refusing to sit and refusing to get clothed. As well as being both verbally and physically abusive to the nurses trying to aid her back to bed so she would not fall.   Dr. Thereasa Solo provided new orders, 6mg  Ativan and 5mg  Haldol was provided over the course of an hour. She was not responding to treatment or reasoning, security was called to aid in helping apply restraints. They spoke at length explaining the benefit of staying in the bed for safety.   Upon security leaving patient proceeded to yell and scream out as well as pull her body down trying to remove her posey belt. Restraints reapplied.  RN has remained at bedside with the patient through this entire event and until shift change. Will continued to monitor.    * patient is refusing tele monitor, Dr Thereasa Solo aware. Will reapply once able to.

## 2017-02-24 LAB — HEPATITIS PANEL, ACUTE
HCV Ab: <0.1 s/co ratio (ref 0.0–0.9)
HEP B S AG: NEGATIVE
Hep A IgM: NEGATIVE
Hep B C IgM: NEGATIVE

## 2017-02-24 LAB — BASIC METABOLIC PANEL
ANION GAP: 3 — AB (ref 5–15)
BUN: <5 mg/dL — ABNORMAL LOW (ref 6–20)
CHLORIDE: 108 mmol/L (ref 101–111)
CO2: 28 mmol/L (ref 22–32)
Calcium: 8.4 mg/dL — ABNORMAL LOW (ref 8.9–10.3)
Creatinine, Ser: 0.62 mg/dL (ref 0.44–1.00)
GFR calc Af Amer: >60 mL/min (ref >60)
GLUCOSE: 104 mg/dL — AB (ref 65–99)
POTASSIUM: 3.1 mmol/L — AB (ref 3.5–5.1)
Sodium: 139 mmol/L (ref 135–145)

## 2017-02-24 MED ORDER — CLONIDINE HCL 0.2 MG/24HR TD PTWK
0.2000 mg | MEDICATED_PATCH | TRANSDERMAL | Status: DC
  Administered 2017-02-24: 0.2 mg via TRANSDERMAL
  Filled 2017-02-24: qty 1

## 2017-02-24 NOTE — Care Management Note (Addendum)
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  Presented to ED after seizure activity at a grocery store,she is in soft wrist ankle and waist restraints.  She is confused.  She is on ciwa protocol, here with ETOH dependence with withdrawal, seizure, severe hypokalemia, hypomagnesemia,thrombocytopenia, alcoholic hepatitis. NCM will cont to follow for dc needs.       3/6 [name] RN, BSN- conts to be in restraints  Today.  NCM will cont to follow.              Action/Plan:   Expected Discharge Date:                  Expected Discharge Plan:  Home/Self Care  In-House Referral:     Discharge planning Services  CM Consult  Post Acute Care Choice:    Choice offered to:     DME Arranged:    DME Agency:     HH Arranged:    HH Agency:     Status of Service:  In process, will continue to follow  If discussed at Long Length of Stay Meetings, dates discussed:    Additional Comments:  Zenon Mayo, RN 02/24/2017, 5:25 PM

## 2017-02-24 NOTE — Progress Notes (Signed)
[place] TEAM 1 - Stepdown/ICU TEAM  MARILLA RHEAMS  [id number] DOB: 13-Jan-1966 DOA: 02/22/2017 PCP: No PCP Per Patient    Brief Narrative:  51 y.o. female with history of anxiety, hypertension, and alcohol abuse who presented to the ED after seizure-like activity at a grocery store. Patient reports 7-8 drinks per day. She reports waking from sleep with tremors which improve after a drink of vodka. She went to the grocery store where she was noted by bystanders to exhibit generalized seizure-like activity. EMS was called and witnessed another episode that they described as a tonic-clonic seizure. She was given 2.5 mg Versed en route to the hospital.   Subjective: The patient is presently in bed in soft wrist ankle and waist restraints.  She is playing with her hand mitten telling me it is a cute little puppy.  She is in no respiratory distress and actually appears quite calm though she is clearly very confused at this time.  Assessment & Plan:  EtOH dependence w/ withdrawal  Requiring both scheduled and sliding scale Ativan but presently much better controlled than yesterday afternoon - continue to support and follow closely  Seizure MRI without acute findings - most consistent with alcohol withdrawal seizure - canceled EEG as it would not be able to be accomplished at present and is likely not needed - treat alcohol withdrawal  Severe Hypokalemia Improving - continue supplementation and recheck in a.m.  Hypomagnesemia Corrected   Thrombocytopenia Due to alcoholism  Alcoholic hepatitis  Maddrey DF score 11.7 on admission   DVT prophylaxis: SCDs only as plt < 100 and pt high fall risk  Code Status: FULL CODE Family Communication: no family present at time of exam  Disposition Plan: SDU  Consultants:  None  Procedures: None  Antimicrobials:  None  Objective: Blood pressure (!) 154/97, pulse 70, temperature 97.8 F (36.6 C), temperature source Oral, resp. rate (!)  21, height 5\' 6"  (1.676 m), weight 78.9 kg (174 lb), SpO2 92 %.  Intake/Output Summary (Last 24 hours) at 02/24/17 1453 Last data filed at 02/24/17 1300  Gross per 24 hour  Intake              510 ml  Output              500 ml  Net               10 ml   Filed Weights   02/22/17 1609 02/24/17 0500  Weight: 77.1 kg (170 lb) 78.9 kg (174 lb)    Examination: General: No acute respiratory distress - alert and confused but calm Lungs: Clear to auscultation bilaterally - no wheezing  Cardiovascular: Regular rate and rhythm without murmur  Abdomen: Nontender, nondistended, soft, bowel sounds positive, no rebound, no ascites, no appreciable mass Extremities: No significant edema bilateral lower extremities  CBC:  Recent Labs Lab 02/22/17 1626 02/23/17 0231  WBC 3.9* 4.4  NEUTROABS 2.6 2.6  HGB 15.8* 13.0  HCT 45.1 37.6  MCV 102.3* 103.9*  PLT 108* 91*   Basic Metabolic Panel:  Recent Labs Lab 02/22/17 1626 02/22/17 1947 02/23/17 0231 02/24/17 0917  NA 138  --  138 139  K 2.7*  --  2.5* 3.1*  CL 94*  --  102 108  CO2 19*  --  26 28  GLUCOSE 194*  --  100* 104*  BUN <5*  --  <5* <5*  CREATININE 1.07*  --  0.80 0.62  CALCIUM 10.8*  --  9.3 8.4*  MG  --  1.0* 2.1  --    GFR: Estimated Creatinine Clearance: 91.1 mL/min (by C-G formula based on SCr of 0.62 mg/dL).  Liver Function Tests:  Recent Labs Lab 02/22/17 1626 02/23/17 0231  AST 257* 212*  ALT 107* 88*  ALKPHOS 121 105  BILITOT 3.0* 3.8*  PROT 6.9 5.8*  ALBUMIN 3.5 2.9*    Coagulation Profile:  Recent Labs Lab 02/22/17 1947  INR 1.17    CBG:  Recent Labs Lab 02/23/17 0848  GLUCAP 85    Recent Results (from the past 240 hour(s))  MRSA PCR Screening     Status: None   Collection Time: 02/23/17  2:20 AM  Result Value Ref Range Status   MRSA by PCR NEGATIVE NEGATIVE Final    Comment:        The GeneXpert MRSA Assay (FDA approved for NASAL specimens only), is one component of  a comprehensive MRSA colonization surveillance program. It is not intended to diagnose MRSA infection nor to guide or monitor treatment for MRSA infections.      Scheduled Meds: . cloNIDine  0.1 mg Oral TID  . folic acid  1 mg Oral Daily  . LORazepam  2 mg Intravenous Q6H  . multivitamin with minerals  1 tablet Oral Daily  . nicotine  14 mg Transdermal Daily  . sodium chloride flush  3 mL Intravenous Q12H      LOS: 2 days   Cherene Altes, MD [facility] Office  [phone] Pager - Text Page per Amion as per below:  On-Call/Text Page:      [email or web address]      password [email or web address]  If 7PM-7AM, please contact night-coverage [email or web address] Password [email or web address] 02/24/2017, 2:53 PM

## 2017-02-24 NOTE — Progress Notes (Signed)
Informed Dr. Thereasa Solo of patient's CIWA being consistently >20 and patient becoming increasingly agitated, swearing at staff, etc. He stated she is actually improved since last night. Stated to continue giving scheduled and prn ativan as needed. Will monitor.  Joellen Jersey, RN.

## 2017-02-25 DIAGNOSIS — R569 Unspecified convulsions: Secondary | ICD-10-CM

## 2017-02-25 LAB — BASIC METABOLIC PANEL
ANION GAP: 9 (ref 5–15)
BUN: 5 mg/dL — AB (ref 6–20)
CO2: 28 mmol/L (ref 22–32)
Calcium: 8.8 mg/dL — ABNORMAL LOW (ref 8.9–10.3)
Chloride: 104 mmol/L (ref 101–111)
Creatinine, Ser: 0.62 mg/dL (ref 0.44–1.00)
GFR calc Af Amer: >60 mL/min (ref >60)
Glucose, Bld: 78 mg/dL (ref 65–99)
POTASSIUM: 3.4 mmol/L — AB (ref 3.5–5.1)
SODIUM: 141 mmol/L (ref 135–145)

## 2017-02-25 LAB — MAGNESIUM: MAGNESIUM: 1.3 mg/dL — AB (ref 1.7–2.4)

## 2017-02-25 LAB — PHOSPHORUS: Phosphorus: 2.7 mg/dL (ref 2.5–4.6)

## 2017-02-25 MED ORDER — MAGNESIUM SULFATE 2 GM/50ML IV SOLN
2.0000 g | Freq: Once | INTRAVENOUS | Status: AC
  Administered 2017-02-25: 2 g via INTRAVENOUS
  Filled 2017-02-25: qty 50

## 2017-02-25 NOTE — Progress Notes (Signed)
Patient noted HR was in the low 40's-50's sinus bradycardia which initially was sinus tachycardia 120's-130's. Having some missed beats in the monitor. Magnesium level was 1.3 this am. Patient sleepy and calm at present. Tylene Fantasia NP made aware with orders made. Due dose of ativan was held due to patient sleepy but arousable. Rema Jasmine with order okay to hold dose for now that's due. Will continue to monitor.

## 2017-02-25 NOTE — Clinical Social Work Note (Signed)
CSW acknowledges substance abuse consult. Patient currently in restraints and not fully oriented. CSW will continue to follow progress and will meet with patient to discuss substance abuse resources when appropriate.  Cynthia Schroeder, [place]

## 2017-02-25 NOTE — Progress Notes (Signed)
[place] TEAM 1 - Stepdown/ICU TEAM  Cynthia Schroeder  [id number] DOB: 06/08/1966 DOA: 02/22/2017 PCP: No PCP Per Patient    Brief Narrative:  51 y.o. female with history of anxiety, hypertension, and alcohol abuse who presented to the ED after seizure-like activity at a grocery store. Patient reports 7-8 drinks per day. She reports waking from sleep with tremors which improve after a drink of vodka. She went to the grocery store where she was noted by bystanders to exhibit generalized seizure-like activity. EMS was called and witnessed another episode that they described as a tonic-clonic seizure. She was given 2.5 mg Versed en route to the hospital.   Subjective: Patient is calm this morning and does not of any complaints. She will be required one-time dose of when necessary Ativan last night. She is slightly confused this morning but denies any complaints. While I was leaving for the room she asked for her workout clothes to get dressed so she can go workout. She is not combative at this point.  Assessment & Plan:  EtOH dependence w/ withdrawal   yesterday she was requiring both scheduled and when necessary Ativan but since late last night she has been doing uch better. Will hold off scheduled Ativan a this time and continue when necessary doses. Restraints to discontinued at this time but if she does et combative it can be reordered. I'm also going to discontinue Haldol for now Cont supplements  Seizure MRI without acute findings - most consistent with alcohol withdrawal seizure - canceled EEG as it would not be able to be accomplished at present and is likely not needed - treat alcohol withdrawal  Severe Hypokalemia Improving - continue supplementation and recheck in a.m. Replete magnesium  Hypomagnesemia Needs repletion today.  Thrombocytopenia Due to alcoholism  Alcoholic hepatitis  Maddrey DF score 11.7 on admission   DVT prophylaxis: SCDs only as plt < 100 and pt high  fall risk  Code Status: FULL CODE Family Communication: no family present at time of exam  Disposition Plan: SDU. They've remained stable she can be transitioned out of stepdown unit tomorrow.  Consultants:  None  Procedures: None  Antimicrobials:  None  Objective: Blood pressure (!) 143/108, pulse 64, temperature 98.8 F (37.1 C), temperature source Axillary, resp. rate 13, height 5\' 6"  (1.676 m), weight 78.9 kg (174 lb), SpO2 100 %.  Intake/Output Summary (Last 24 hours) at 02/25/17 1305 Last data filed at 02/25/17 1100  Gross per 24 hour  Intake              340 ml  Output              300 ml  Net               40 ml   Filed Weights   02/22/17 1609 02/24/17 0500  Weight: 77.1 kg (170 lb) 78.9 kg (174 lb)    Examination: General: No acute respiratory distress - alert and confused but calm Lungs: Clear to auscultation bilaterally - no wheezing  Cardiovascular: Regular rate and rhythm without murmur  Abdomen: Nontender, nondistended, soft, bowel sounds positive, no rebound, no ascites, no appreciable mass Extremities: No significant edema bilateral lower extremities  CBC:  Recent Labs Lab 02/22/17 1626 02/23/17 0231  WBC 3.9* 4.4  NEUTROABS 2.6 2.6  HGB 15.8* 13.0  HCT 45.1 37.6  MCV 102.3* 103.9*  PLT 108* 91*   Basic Metabolic Panel:  Recent Labs Lab 02/22/17 1626 02/22/17 1947 02/23/17 0231  02/24/17 0917 02/25/17 0321  NA 138  --  138 139 141  K 2.7*  --  2.5* 3.1* 3.4*  CL 94*  --  102 108 104  CO2 19*  --  26 28 28   GLUCOSE 194*  --  100* 104* 78  BUN <5*  --  <5* <5* 5*  CREATININE 1.07*  --  0.80 0.62 0.62  CALCIUM 10.8*  --  9.3 8.4* 8.8*  MG  --  1.0* 2.1  --  1.3*  PHOS  --   --   --   --  2.7   GFR: Estimated Creatinine Clearance: 91.1 mL/min (by C-G formula based on SCr of 0.62 mg/dL).  Liver Function Tests:  Recent Labs Lab 02/22/17 1626 02/23/17 0231  AST 257* 212*  ALT 107* 88*  ALKPHOS 121 105  BILITOT 3.0* 3.8*  PROT  6.9 5.8*  ALBUMIN 3.5 2.9*    Coagulation Profile:  Recent Labs Lab 02/22/17 1947  INR 1.17    CBG:  Recent Labs Lab 02/23/17 0848  GLUCAP 85    Recent Results (from the past 240 hour(s))  MRSA PCR Screening     Status: None   Collection Time: 02/23/17  2:20 AM  Result Value Ref Range Status   MRSA by PCR NEGATIVE NEGATIVE Final    Comment:        The GeneXpert MRSA Assay (FDA approved for NASAL specimens only), is one component of a comprehensive MRSA colonization surveillance program. It is not intended to diagnose MRSA infection nor to guide or monitor treatment for MRSA infections.      Scheduled Meds: . cloNIDine  0.2 mg Transdermal Weekly  . folic acid  1 mg Oral Daily  . LORazepam  2 mg Intravenous Q6H  . multivitamin with minerals  1 tablet Oral Daily  . nicotine  14 mg Transdermal Daily  . sodium chloride flush  3 mL Intravenous Q12H      LOS: 3 days   Gerlean Ren MD [email or web address]  On-Call/Text Page:      [email or web address]      password [email or web address]  If 7PM-7AM, please contact night-coverage [email or web address] Password [email or web address] 02/25/2017, 1:05 PM

## 2017-02-26 LAB — COMPREHENSIVE METABOLIC PANEL
ALBUMIN: 2.5 g/dL — AB (ref 3.5–5.0)
ALT: 82 U/L — ABNORMAL HIGH (ref 14–54)
ANION GAP: 8 (ref 5–15)
AST: 157 U/L — ABNORMAL HIGH (ref 15–41)
Alkaline Phosphatase: 101 U/L (ref 38–126)
BILIRUBIN TOTAL: 1.8 mg/dL — AB (ref 0.3–1.2)
BUN: 8 mg/dL (ref 6–20)
CHLORIDE: 101 mmol/L (ref 101–111)
CO2: 26 mmol/L (ref 22–32)
Calcium: 8.5 mg/dL — ABNORMAL LOW (ref 8.9–10.3)
Creatinine, Ser: 0.63 mg/dL (ref 0.44–1.00)
GFR calc Af Amer: >60 mL/min (ref >60)
GFR calc non Af Amer: >60 mL/min (ref >60)
GLUCOSE: 151 mg/dL — AB (ref 65–99)
POTASSIUM: 3.1 mmol/L — AB (ref 3.5–5.1)
SODIUM: 135 mmol/L (ref 135–145)
TOTAL PROTEIN: 5.3 g/dL — AB (ref 6.5–8.1)

## 2017-02-26 LAB — MAGNESIUM: MAGNESIUM: 1.7 mg/dL (ref 1.7–2.4)

## 2017-02-26 MED ORDER — MAGNESIUM SULFATE 2 GM/50ML IV SOLN
2.0000 g | Freq: Once | INTRAVENOUS | Status: AC
  Administered 2017-02-26: 2 g via INTRAVENOUS
  Filled 2017-02-26: qty 50

## 2017-02-26 MED ORDER — POTASSIUM CHLORIDE CRYS ER 20 MEQ PO TBCR
20.0000 meq | EXTENDED_RELEASE_TABLET | Freq: Three times a day (TID) | ORAL | Status: AC
  Administered 2017-02-26 – 2017-02-27 (×5): 20 meq via ORAL
  Filled 2017-02-26 (×5): qty 1

## 2017-02-26 NOTE — Progress Notes (Signed)
[place] TEAM 1 - Stepdown/ICU TEAM  GERARD BONUS  [id number] DOB: 07-10-1966 DOA: 02/22/2017 PCP: No PCP Per Patient    Brief Narrative:  51 y.o. female with history of anxiety, hypertension, and alcohol abuse who presented to the ED after seizure-like activity at a grocery store. Patient reports 7-8 drinks per day. She reports waking from sleep with tremors which improve after a drink of vodka. She went to the grocery store where she was noted by bystanders to exhibit generalized seizure-like activity. EMS was called and witnessed another episode that they described as a tonic-clonic seizure. She was given 2.5 mg Versed en route to the hospital.   Subjective: The patient is very calm at time of visit.  She is alert and much more oriented but not entirely back to her baseline mental status.  She cannot remember the specific details of what happened to her and does not remember that she has gone through alcohol withdrawal.  She denies chest pain nausea vomiting or shortness of breath.  Assessment & Plan:  EtOH dependence w/ withdrawal  Appears to be coming through withdrawal nicely at this time - scheduled Ativan has been stopped and we'll dose only according to CIWA on a when necessary basis  Seizure MRI without acute findings - most consistent with alcohol withdrawal seizure - canceled EEG as it would not be able to be accomplished at present and is likely not needed  Severe Hypokalemia Persists - cont to replace and follow   Hypomagnesemia Supplement again today w/ goal of 2.0  Thrombocytopenia Due to alcoholism  Alcoholic hepatitis  Maddrey DF score 11.7 on admission - LFTs slowly improving - educate pt on need to entirely abstain from EtOH permanently when she is more oriented   DVT prophylaxis: SCDs only as plt < 100 and pt high fall risk  Code Status: FULL CODE Family Communication: no family present at time of exam  Disposition Plan: SDU - begin PT/OT - if remains  stable overnight could likely transfer ot med bed 3/8  Consultants:  None  Procedures: None  Antimicrobials:  None  Objective: Blood pressure (!) 150/94, pulse 79, temperature 97.8 F (36.6 C), temperature source Oral, resp. rate 18, height 5\' 6"  (1.676 m), weight 79.5 kg (175 lb 4.3 oz), SpO2 97 %.  Intake/Output Summary (Last 24 hours) at 02/26/17 1436 Last data filed at 02/26/17 0900  Gross per 24 hour  Intake              460 ml  Output              650 ml  Net             -190 ml   Filed Weights   02/22/17 1609 02/24/17 0500 02/26/17 0432  Weight: 77.1 kg (170 lb) 78.9 kg (174 lb) 79.5 kg (175 lb 4.3 oz)    Examination: General: No acute respiratory distress - alert and pleasant  Lungs: Clear to auscultation bilaterally  Cardiovascular: Regular rate and rhythm without murmur or rub  Abdomen: Nontender, nondistended, soft, bowel sounds positive Extremities: No edema bilateral lower extremities  CBC:  Recent Labs Lab 02/22/17 1626 02/23/17 0231  WBC 3.9* 4.4  NEUTROABS 2.6 2.6  HGB 15.8* 13.0  HCT 45.1 37.6  MCV 102.3* 103.9*  PLT 108* 91*   Basic Metabolic Panel:  Recent Labs Lab 02/22/17 1626 02/22/17 1947 02/23/17 0231 02/24/17 0917 02/25/17 0321 02/26/17 0240  NA 138  --  138 139 141  135  K 2.7*  --  2.5* 3.1* 3.4* 3.1*  CL 94*  --  102 108 104 101  CO2 19*  --  26 28 28 26   GLUCOSE 194*  --  100* 104* 78 151*  BUN <5*  --  <5* <5* 5* 8  CREATININE 1.07*  --  0.80 0.62 0.62 0.63  CALCIUM 10.8*  --  9.3 8.4* 8.8* 8.5*  MG  --  1.0* 2.1  --  1.3* 1.7  PHOS  --   --   --   --  2.7  --    GFR: Estimated Creatinine Clearance: 91.5 mL/min (by C-G formula based on SCr of 0.63 mg/dL).  Liver Function Tests:  Recent Labs Lab 02/22/17 1626 02/23/17 0231 02/26/17 0240  AST 257* 212* 157*  ALT 107* 88* 82*  ALKPHOS 121 105 101  BILITOT 3.0* 3.8* 1.8*  PROT 6.9 5.8* 5.3*  ALBUMIN 3.5 2.9* 2.5*    Coagulation Profile:  Recent Labs Lab  02/22/17 1947  INR 1.17    CBG:  Recent Labs Lab 02/23/17 0848  GLUCAP 85    Recent Results (from the past 240 hour(s))  MRSA PCR Screening     Status: None   Collection Time: 02/23/17  2:20 AM  Result Value Ref Range Status   MRSA by PCR NEGATIVE NEGATIVE Final    Comment:        The GeneXpert MRSA Assay (FDA approved for NASAL specimens only), is one component of a comprehensive MRSA colonization surveillance program. It is not intended to diagnose MRSA infection nor to guide or monitor treatment for MRSA infections.      Scheduled Meds: . cloNIDine  0.2 mg Transdermal Weekly  . folic acid  1 mg Oral Daily  . multivitamin with minerals  1 tablet Oral Daily  . nicotine  14 mg Transdermal Daily  . sodium chloride flush  3 mL Intravenous Q12H      LOS: 4 days   Cherene Altes, MD [facility] Office  [phone] Pager - Text Page per Amion as per below:  On-Call/Text Page:      [email or web address]      password [email or web address]  If 7PM-7AM, please contact night-coverage [email or web address] Password [facility] 02/26/2017, 2:36 PM

## 2017-02-26 NOTE — Plan of Care (Signed)
Problem: Safety: Goal: Ability to remain free from injury will improve Outcome: Progressing Patient more oriented today. Using call bell appropriately the majority of the time.   Problem: Education: Goal: Knowledge of disease or condition will improve Outcome: Progressing Patient states she has been dealing with these issues for a very long time. Aware of the disease process of alcoholism.  Goal: Understanding of discharge needs will improve Outcome: Progressing SW/CM consults placed for referrals. Patient spent a month in [place] in December 2017.

## 2017-02-27 LAB — COMPREHENSIVE METABOLIC PANEL
ALBUMIN: 2.5 g/dL — AB (ref 3.5–5.0)
ALT: 71 U/L — ABNORMAL HIGH (ref 14–54)
ANION GAP: 7 (ref 5–15)
AST: 110 U/L — ABNORMAL HIGH (ref 15–41)
Alkaline Phosphatase: 95 U/L (ref 38–126)
BUN: 7 mg/dL (ref 6–20)
CO2: 25 mmol/L (ref 22–32)
Calcium: 8.6 mg/dL — ABNORMAL LOW (ref 8.9–10.3)
Chloride: 106 mmol/L (ref 101–111)
Creatinine, Ser: 0.56 mg/dL (ref 0.44–1.00)
GFR calc non Af Amer: >60 mL/min (ref >60)
GLUCOSE: 112 mg/dL — AB (ref 65–99)
POTASSIUM: 3.4 mmol/L — AB (ref 3.5–5.1)
SODIUM: 138 mmol/L (ref 135–145)
Total Bilirubin: 1 mg/dL (ref 0.3–1.2)
Total Protein: 5.4 g/dL — ABNORMAL LOW (ref 6.5–8.1)

## 2017-02-27 LAB — MAGNESIUM: Magnesium: 1.7 mg/dL (ref 1.7–2.4)

## 2017-02-27 NOTE — Progress Notes (Signed)
[place] TEAM 1 - Stepdown/ICU TEAM  Cynthia Schroeder  WER:[id number] DOB: [birth year] DOA: 02/22/2017 PCP: No PCP Per Patient    Brief Narrative:  51 y.o. female with history of anxiety, hypertension, and alcohol abuse who presented to the ED after seizure-like activity at a grocery store. Patient reports 7-8 drinks per day. She reports waking from sleep with tremors which improve after a drink of vodka. She went to the grocery store where she was noted by bystanders to exhibit generalized seizure-like activity. EMS was called and witnessed another episode that they described as a tonic-clonic seizure. She was given 2.5 mg Versed en route to the hospital.   Subjective: Patient is very calm today and does not of any complaints. She is mentating well and feels like she is back to her baseline.  Assessment & Plan:  EtOH dependence w/ withdrawal  At this time her withdrawal symptoms seems to have been resolved  case manager to provide her with a list of substance abuse treatment Center program I have counseled her to refrain from alcohol use   Seizure sMRI without acute findings - most consistent with alcohol withdrawal seizure - canceled EEG as it would not be able to be accomplished at present and is likely not needed  Severe Hypokalemia-improved  Persists - cont to replace and follow   Hypomagnesemia-improved  Supplement again today w/ goal of 2.0  Thrombocytopenia Due to alcoholism  Alcoholic hepatitis  Maddrey DF score 11.7 on admission - LFTs slowly improving - educate pt on need to entirely abstain from EtOH permanently when she is more oriented   DVT prophylaxis: SCDs  Code Status: FULL CODE Family Communication: no family present at time of exam . Her mother to wait outside the room as I speak with the patient.  Disposition:  I will transfer her to medical surgical unit for overnight monitoring. If she continues to well she can be discharged tomorrow.     Procedures: None  Antimicrobials:  None  Objective: Blood pressure 135/87, pulse 92, temperature 98.7 F (37.1 C), temperature source Oral, resp. rate (!) 22, height 5\' 6"  (1.676 m), weight 79 kg (174 lb 2.6 oz), SpO2 96 %.  Intake/Output Summary (Last 24 hours) at 02/27/17 1653 Last data filed at 02/27/17 0845  Gross per 24 hour  Intake             1180 ml  Output             1001 ml  Net              179 ml   Filed Weights   02/24/17 0500 02/26/17 0432 02/27/17 0400  Weight: 78.9 kg (174 lb) 79.5 kg (175 lb 4.3 oz) 79 kg (174 lb 2.6 oz)    Examination: General: No acute respiratory distress - alert and pleasant  Lungs: Clear to auscultation bilaterally  Cardiovascular: Regular rate and rhythm without murmur or rub  Abdomen: Nontender, nondistended, soft, bowel sounds positive Extremities: No edema bilateral lower extremities  CBC:  Recent Labs Lab 02/22/17 1626 02/23/17 0231  WBC 3.9* 4.4  NEUTROABS 2.6 2.6  HGB 15.8* 13.0  HCT 45.1 37.6  MCV 102.3* 103.9*  PLT 108* 91*   Basic Metabolic Panel:  Recent Labs Lab 02/22/17 1947 02/23/17 0231 02/24/17 0917 02/25/17 0321 02/26/17 0240 02/27/17 0319  NA  --  138 139 141 135 138  K  --  2.5* 3.1* 3.4* 3.1* 3.4*  CL  --  102 108  104 101 106  CO2  --  26 28 28 26 25   GLUCOSE  --  100* 104* 78 151* 112*  BUN  --  <5* <5* 5* 8 7  CREATININE  --  0.80 0.62 0.62 0.63 0.56  CALCIUM  --  9.3 8.4* 8.8* 8.5* 8.6*  MG 1.0* 2.1  --  1.3* 1.7 1.7  PHOS  --   --   --  2.7  --   --    GFR: Estimated Creatinine Clearance: 91.2 mL/min (by C-G formula based on SCr of 0.56 mg/dL).  Liver Function Tests:  Recent Labs Lab 02/22/17 1626 02/23/17 0231 02/26/17 0240 02/27/17 0319  AST 257* 212* 157* 110*  ALT 107* 88* 82* 71*  ALKPHOS 121 105 101 95  BILITOT 3.0* 3.8* 1.8* 1.0  PROT 6.9 5.8* 5.3* 5.4*  ALBUMIN 3.5 2.9* 2.5* 2.5*    Coagulation Profile:  Recent Labs Lab 02/22/17 1947  INR 1.17     CBG:  Recent Labs Lab 02/23/17 0848  GLUCAP 85    Recent Results (from the past 240 hour(s))  MRSA PCR Screening     Status: None   Collection Time: 02/23/17  2:20 AM  Result Value Ref Range Status   MRSA by PCR NEGATIVE NEGATIVE Final    Comment:        The GeneXpert MRSA Assay (FDA approved for NASAL specimens only), is one component of a comprehensive MRSA colonization surveillance program. It is not intended to diagnose MRSA infection nor to guide or monitor treatment for MRSA infections.      Scheduled Meds: . cloNIDine  0.2 mg Transdermal Weekly  . folic acid  1 mg Oral Daily  . multivitamin with minerals  1 tablet Oral Daily  . nicotine  14 mg Transdermal Daily  . potassium chloride  20 mEq Oral TID  . sodium chloride flush  3 mL Intravenous Q12H      LOS: 5 days   Gerlean Ren MD [facility] Office  [phone] Pager - Text Page per Shea Evans as per below:  On-Call/Text Page:      [email or web address]      password [email or web address]  If 7PM-7AM, please contact night-coverage [email or web address] Password [email or web address] 02/27/2017, 4:53 PM

## 2017-02-27 NOTE — Clinical Social Work Note (Signed)
Clinical Social Work Assessment  Patient Details  Name: Cynthia Schroeder MRN: [id number] Date of Birth: 1966-01-15  Date of referral:  02/27/17               Reason for consult:  Substance Use/ETOH Abuse, Mental Health Concerns                Permission sought to share information with:    Permission granted to share information::  No  Name::        Agency::     Relationship::     Contact Information:     Housing/Transportation Living arrangements for the past 2 months:  Single Family Home Source of Information:  Patient, Medical Team Patient Interpreter Needed:  None Criminal Activity/Legal Involvement Pertinent to Current Situation/Hospitalization:  No - Comment as needed Significant Relationships:  Parents Lives with:  Parents Do you feel safe going back to the place where you live?  Yes Need for family participation in patient care:  Yes (Comment)  Care giving concerns:  Consult for substance abuse resources.   Social Worker assessment / plan:  CSW met with patient. No supports at bedside. CSW introduced role and inquired about any needed resources. Patient stated that she was at [facility] for 28 days recently but was discharged because they could not adequately meet her mental health needs. Patient briefly explained her traumatic past. Patient interested in finding a mental health counselor that can specialize in these needs. CSW provided list of resources for both outpatient and residential substance abuse treatment centers as well as outpatient counseling resources. CSW encouraged to call the facilities to find out if they can meet her needs and are a good fit for her. Patient appreciated resources. No further concerns. CSW encouraged patient to contact CSW as needed. CSW signing off. Consult again if any other social work needs arise.  Employment status:  [employer] information:  Other (Comment Required) Nurse, mental health) PT Recommendations:  Home with Dana / Referral to community resources:  Residential Substance Abuse Treatment Options, Outpatient Substance Abuse Treatment Options  Patient/Family's Response to care:  Patient agreeable to receiving resources. Patient's mother supportive and involved in patient's care. Patient appreciated social work intervention.  Patient/Family's Understanding of and Emotional Response to Diagnosis, Current Treatment, and Prognosis:  Patient appears to have a good understanding of the reason for admission. Patient appears happy with hospital care.  Emotional Assessment Appearance:  Appears stated age Attitude/Demeanor/Rapport:  Other (Pleasant) Affect (typically observed):  Accepting, Appropriate, Calm, Pleasant Orientation:  Oriented to Self, Oriented to Place, Oriented to Situation, Oriented to  Time Alcohol / Substance use:  Alcohol Use Psych involvement (Current and /or in the community):  No (Comment)  Discharge Needs  Concerns to be addressed:  Care Coordination Readmission within the last 30 days:  No Current discharge risk:  Substance Abuse Barriers to Discharge:  Continued Medical Work up   Candie Chroman, LCSW 02/27/2017, 4:25 PM

## 2017-02-27 NOTE — Evaluation (Signed)
Physical Therapy Evaluation Patient Details Name: Cynthia Schroeder MRN: [id number] DOB: 1966-02-15 Today's Date: 02/27/2017   History of Present Illness  Pt is a 51 y/o female admitted for seizure like activity found to be in alcohol withdrawal. Of note, pt recently d/c'd from an inpatient rehabilitation program for alcohol abuse. PMH including but not limited to anxiety and HTN.  Clinical Impression  Pt presented supine in bed with HOB elevated, awake and willing to participate in therapy session. Prior to admission, pt reported that she was independent with all functional mobility and ADLs. Pt currently requires min guard for safety with ambulation as she was mildly unsteady but no LOB. All VSS throughout. Pt would continue to benefit from skilled physical therapy services at this time while admitted and after d/c to address her below listed limitations in order to improve her overall safety and independence with functional mobility.      Follow Up Recommendations Home health PT    Equipment Recommendations  None recommended by PT    Recommendations for Other Services       Precautions / Restrictions Precautions Precautions: Fall (seizure precautions) Restrictions Weight Bearing Restrictions: No      Mobility  Bed Mobility Overal bed mobility: Modified Independent                Transfers Overall transfer level: Needs assistance Equipment used: None Transfers: Sit to/from Stand Sit to Stand: Supervision         General transfer comment: increased time, supervision for safety  Ambulation/Gait Ambulation/Gait assistance: Min guard Ambulation Distance (Feet): 300 Feet Assistive device: None Gait Pattern/deviations: Step-through pattern;Decreased step length - right;Decreased step length - left;Decreased stride length;Drifts right/left Gait velocity: decreased Gait velocity interpretation: Below normal speed for age/gender General Gait Details: mildly unsteady  gait but not LOB or need for physical assistance, min guard for safety  Stairs            Wheelchair Mobility    Modified Rankin (Stroke Patients Only)       Balance Overall balance assessment: Needs assistance;History of Falls Sitting-balance support: Feet supported Sitting balance-Leahy Scale: Good Sitting balance - Comments: pt able to don shoes sitting EOB with supervision   Standing balance support: During functional activity;No upper extremity supported Standing balance-Leahy Scale: Fair                               Pertinent Vitals/Pain Pain Assessment: No/denies pain    Home Living Family/patient expects to be discharged to:: Private residence Living Arrangements: Parent Available Help at Discharge: Family;Available PRN/intermittently Type of Home: House Home Access: Stairs to enter Entrance Stairs-Rails: None Entrance Stairs-Number of Steps: 1 Home Layout: One level Home Equipment: Crutches;Cane - single point;Shower seat;Walker - 2 wheels      Prior Function Level of Independence: Independent               Hand Dominance        Extremity/Trunk Assessment   Upper Extremity Assessment Upper Extremity Assessment: Defer to OT evaluation    Lower Extremity Assessment Lower Extremity Assessment: Overall WFL for tasks assessed       Communication   Communication: No difficulties  Cognition Arousal/Alertness: Awake/alert Behavior During Therapy: Restless Overall Cognitive Status: Impaired/Different from baseline Area of Impairment: Memory;Safety/judgement     Memory: Decreased short-term memory   Safety/Judgement: Decreased awareness of safety;Decreased awareness of deficits  General Comments      Exercises     Assessment/Plan    PT Assessment Patient needs continued PT services  PT Problem List Decreased balance;Decreased mobility;Decreased coordination;Decreased cognition;Decreased knowledge of use of  DME;Decreased safety awareness       PT Treatment Interventions DME instruction;Gait training;Stair training;Functional mobility training;Therapeutic activities;Therapeutic exercise;Balance training;Neuromuscular re-education;Cognitive remediation;Patient/family education    PT Goals (Current goals can be found in the Care Plan section)  Acute Rehab PT Goals Patient Stated Goal: return home so that she can take care of her animals PT Goal Formulation: With patient Time For Goal Achievement: 03/13/17 Potential to Achieve Goals: Good    Frequency Min 3X/week   Barriers to discharge        Co-evaluation               End of Session Equipment Utilized During Treatment: Gait belt Activity Tolerance: Patient tolerated treatment well Patient left: in bed;with call bell/phone within reach;with bed alarm set (sitting EOB) Nurse Communication: Mobility status PT Visit Diagnosis: Unsteadiness on feet (R26.81);Other abnormalities of gait and mobility (R26.89)         Time: 7943-2761 PT Time Calculation (min) (ACUTE ONLY): 29 min   Charges:   PT Evaluation $PT Eval Moderate Complexity: 1 Procedure PT Treatments $Gait Training: 8-22 mins   PT G CodesClearnce Sorrel Ayaana Biondo 02/27/2017, 2:55 PM Sherie Don, [place], DPT [phone]

## 2017-02-27 NOTE — Evaluation (Signed)
Occupational Therapy Evaluation and Discharge Patient Details Name: Cynthia Schroeder MRN: [id number] DOB: [birth year] Today's Date: 02/27/2017    History of Present Illness Pt is a 51 y/o female admitted for seizure like activity found to be in alcohol withdrawal. Of note, pt recently d/c'd from an inpatient rehabilitation program for alcohol abuse. PMH including but not limited to anxiety and HTN.   Clinical Impression   Pt requires supervision for safety with mobility and ADL. Will have mother available at home. No further OT needs.    Follow Up Recommendations  No OT follow up    Equipment Recommendations  None recommended by OT    Recommendations for Other Services       Precautions / Restrictions Precautions Precautions: Fall (seizure precautions) Restrictions Weight Bearing Restrictions: No      Mobility Bed Mobility Overal bed mobility: Modified Independent                Transfers Overall transfer level: Needs assistance Equipment used: None Transfers: Sit to/from Stand Sit to Stand: Supervision         General transfer comment: supervision for safety    Balance Overall balance assessment: Needs assistance;History of Falls Sitting-balance support: Feet supported Sitting balance-Leahy Scale: Good Sitting balance - Comments: pt able to don shoes sitting EOB with supervision   Standing balance support: During functional activity;No upper extremity supported Standing balance-Leahy Scale: Fair                              ADL                                         General ADL Comments: pt requiring supervision for safety     Vision Baseline Vision/History: Wears glasses Patient Visual Report: No change from baseline       Perception     Praxis      Pertinent Vitals/Pain Pain Assessment: Faces Faces Pain Scale: Hurts little more Pain Location: R LE Pain Descriptors / Indicators: Aching Pain Intervention(s):  Monitored during session;Repositioned     Hand Dominance Right   Extremity/Trunk Assessment Upper Extremity Assessment Upper Extremity Assessment: Overall WFL for tasks assessed (mild tremor)   Lower Extremity Assessment Lower Extremity Assessment: Defer to PT evaluation       Communication Communication Communication: No difficulties   Cognition Arousal/Alertness: Awake/alert Behavior During Therapy: WFL for tasks assessed/performed Overall Cognitive Status: Impaired/Different from baseline Area of Impairment: Memory;Safety/judgement     Memory: Decreased short-term memory   Safety/Judgement: Decreased awareness of safety;Decreased awareness of deficits         General Comments       Exercises       Shoulder Instructions      Home Living Family/patient expects to be discharged to:: Private residence Living Arrangements: Parent Available Help at Discharge: Family;Available PRN/intermittently Type of Home: House Home Access: Stairs to enter [employer] of Steps: 1 Entrance Stairs-Rails: None Home Layout: One level     Bathroom Shower/Tub: Occupational psychologist: Handicapped height Bathroom Accessibility: Yes How Accessible: Accessible via walker Home Equipment: Crutches;Cane - single point;Walker - 2 wheels;Shower seat - built in          Prior Functioning/Environment Level of Independence: Independent        Comments: drives  OT Problem List: Impaired balance (sitting and/or standing);Decreased safety awareness;Decreased cognition      OT Treatment/Interventions:      OT Goals(Current goals can be found in the care plan section) Acute Rehab OT Goals Patient Stated Goal: return home so that she can take care of her animals  OT Frequency:     Barriers to D/C:            Co-evaluation              End of Session Equipment Utilized During Treatment: Gait belt  Activity Tolerance: Patient tolerated  treatment well Patient left: in chair;with call bell/phone within reach  OT Visit Diagnosis: Unsteadiness on feet (R26.81);Repeated falls (R29.6);Pain Pain - Right/Left: Right Pain - part of body: Leg                ADL either performed or assessed with clinical judgement  Time: 1510-1537 OT Time Calculation (min): 27 min Charges:  OT General Charges $OT Visit: 1 Procedure OT Evaluation $OT Eval Low Complexity: 1 Procedure OT Treatments $Self Care/Home Management : 8-22 mins G-Codes:     Malka So 02/27/2017, 4:02 PM  [phone]

## 2017-02-28 LAB — BASIC METABOLIC PANEL
Anion gap: 7 (ref 5–15)
CHLORIDE: 107 mmol/L (ref 101–111)
CO2: 24 mmol/L (ref 22–32)
Calcium: 8.9 mg/dL (ref 8.9–10.3)
Creatinine, Ser: 0.53 mg/dL (ref 0.44–1.00)
GFR calc Af Amer: >60 mL/min (ref >60)
GFR calc non Af Amer: >60 mL/min (ref >60)
Glucose, Bld: 121 mg/dL — ABNORMAL HIGH (ref 65–99)
Potassium: 3.9 mmol/L (ref 3.5–5.1)
SODIUM: 138 mmol/L (ref 135–145)

## 2017-02-28 LAB — MAGNESIUM: Magnesium: 1.6 mg/dL — ABNORMAL LOW (ref 1.7–2.4)

## 2017-02-28 MED ORDER — NALTREXONE HCL 50 MG PO TABS: 50.0000 mg | ORAL_TABLET | Freq: Every day | ORAL | 0 refills | Status: DC

## 2017-02-28 MED ORDER — FLUOXETINE HCL 20 MG PO CAPS: 20.0000 mg | ORAL_CAPSULE | Freq: Every day | ORAL | 0 refills | Status: DC

## 2017-02-28 MED ORDER — ADULT MULTIVITAMIN W/MINERALS CH: 1.0000 | ORAL_TABLET | Freq: Every day | ORAL | 0 refills | Status: DC

## 2017-02-28 MED ORDER — FOLIC ACID 1 MG PO TABS: 1.0000 mg | ORAL_TABLET | Freq: Every day | ORAL | 1 refills | Status: DC

## 2017-02-28 MED ORDER — NICOTINE 14 MG/24HR TD PT24: 14.0000 mg | MEDICATED_PATCH | Freq: Every day | TRANSDERMAL | 0 refills | Status: AC
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
## 2017-02-28 NOTE — Progress Notes (Signed)
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  Presented to ED after seizure activity at a grocery store,she is in soft wrist ankle and waist restraints.  She is confused.  She is on ciwa protocol, here with ETOH dependence with withdrawal, seizure, severe hypokalemia, hypomagnesemia,thrombocytopenia, alcoholic hepatitis. NCM will cont to follow for dc needs.       3/6 [name] RN, BSN- conts to be in restraints  Today.  NCM will cont to follow.              Action/Plan:   Expected Discharge Date:                         Expected Discharge Plan:  Home/Self Care  In-House Referral:     Discharge planning Services  CM Consult  Post Acute Care Choice:    Choice offered to:     DME Arranged:    DME Agency:     HH Arranged:    HH Agency:     Status of Service:  In process, will continue to follow  If discussed at Long Length of Stay Meetings, dates discussed:    Additional Comments:  02/28/17 Pt medically stable for discharge home today with family member.  She has found a PCP that she plans to follow up with in 1-2 weeks post discharge.  PT/OT recommending no OP follow up.  [name], RN, BSN [phone]   Original note by:  Zenon Mayo, RN 02/24/2017, 5:25 PM

## 2017-02-28 NOTE — Progress Notes (Signed)
Physical Therapy Treatment Patient Details Name: Cynthia Schroeder MRN: [id number] DOB: May 11, 1966 Today's Date: 02/28/2017    History of Present Illness Pt is a 51 y/o female admitted for seizure like activity found to be in alcohol withdrawal. Of note, pt recently d/c'd from an inpatient rehabilitation program for alcohol abuse. PMH including but not limited to anxiety and HTN.    PT Comments    Pt has progressed well with mobility, she ambulated 400' independently without an assistive device, no loss of balance. She has met PT goals and is ready to DC home from PT standpoint. PT signing off.   Follow Up Recommendations  No PT follow up     Equipment Recommendations  None recommended by PT    Recommendations for Other Services       Precautions / Restrictions Precautions Precautions: None Restrictions Weight Bearing Restrictions: No    Mobility  Bed Mobility Overal bed mobility: Modified Independent                Transfers Overall transfer level: Independent Equipment used: None Transfers: Sit to/from Stand Sit to Stand: Independent            Ambulation/Gait Ambulation/Gait assistance: Independent Ambulation Distance (Feet): 400 Feet Assistive device: None Gait Pattern/deviations: WFL(Within Functional Limits)   Gait velocity interpretation: at or above normal speed for age/gender General Gait Details: no LOB, able to perform head turns, look up/down without LOB   Stairs            Wheelchair Mobility    Modified Rankin (Stroke Patients Only)       Balance     Sitting balance-Leahy Scale: Normal       Standing balance-Leahy Scale: Normal                      Cognition Arousal/Alertness: Awake/alert Behavior During Therapy: WFL for tasks assessed/performed Overall Cognitive Status: Within Functional Limits for tasks assessed                      Exercises      General Comments        Pertinent  Vitals/Pain Pain Assessment: 0-10 Pain Score: 3  Pain Location: R thigh with rolling in bed, no pain with walking Pain Descriptors / Indicators: Aching Pain Intervention(s): Limited activity within patient's tolerance;Monitored during session    Home Living                      Prior Function            PT Goals (current goals can now be found in the care plan section) Acute Rehab PT Goals Patient Stated Goal: return home so that she can take care of her animals PT Goal Formulation: With patient Time For Goal Achievement: 03/13/17 Potential to Achieve Goals: Good Progress towards PT goals: Goals met/education completed, patient discharged from PT    Frequency    Min 3X/week      PT Plan Discharge plan needs to be updated    Co-evaluation             End of Session Equipment Utilized During Treatment: Gait belt Activity Tolerance: Patient tolerated treatment well Patient left: with call bell/phone within reach;in bed (sitting EOB) Nurse Communication: Mobility status PT Visit Diagnosis: Unsteadiness on feet (R26.81);Other abnormalities of gait and mobility (R26.89)     Time: 8841-6606 PT Time Calculation (min) (ACUTE ONLY): 11 min  Charges:  $  Gait Training: 8-22 mins                    G Codes:       Philomena Doheny 02/28/2017, 10:54 AM [phone]

## 2017-02-28 NOTE — Discharge Instructions (Signed)
Alcohol Withdrawal When a person who drinks a lot of alcohol stops drinking, he or she may go through alcohol withdrawal. Alcohol withdrawal causes problems. It can make you feel:  Tired (fatigued).  Sad (depressed).  Fearful (anxious).  Grouchy (irritable).  Not hungry.  Sick to your stomach (nauseous).  Shaky. It can also make you have:  Nightmares.  Trouble sleeping.  Trouble thinking clearly.  Mood swings.  Clammy skin.  Very bad sweating.  A very fast heartbeat.  Shaking that you cannot control (tremor).  Having a fever.  A fit of movements that you cannot control (seizure).  Confusion.  Throwing up (vomiting).  Feeling or seeing things that are not there (hallucinations). Follow these instructions at home:  Take medicines and vitamins only as told by your doctor.  Do not drink alcohol.  Have someone around in case you need help.  Drink enough fluid to keep your pee (urine) clear or pale yellow.  Think about joining a group to help you stop drinking. Contact a doctor if:  Your problems get worse.  Your problems do not go away.  You cannot eat or drink without throwing up.  You are having a hard time not drinking alcohol.  You cannot stop drinking alcohol. Get help right away if:  You feel your heart beating differently than usual.  Your chest hurts.  You have trouble breathing.  You have very bad problems, like:  A fever.  A fit of movements that you cannot control.  Being very confused.  Feeling or seeing things that are not there. This information is not intended to replace advice given to you by your health care provider. Make sure you discuss any questions you have with your health care provider. Document Released: 05/27/2008 Document Revised: 05/16/2016 Document Reviewed: 09/27/2014 Elsevier Interactive Patient Education  2017 [employer].   Seizure, Adult A seizure is a sudden burst of abnormal electrical activity in  the brain. The abnormal activity temporarily interrupts normal brain function, causing a person to experience any of the following:  Involuntary movements.  Changes in awareness or consciousness.  Uncontrollable shaking (convulsions). Seizures usually last from 30 seconds to 2 minutes. They usually do not cause permanent brain damage unless they are prolonged. What can cause a seizure to happen?  Seizures can happen for many reasons including:  A fever.  Low blood sugar.  A medicine.  An illnesses.  A brain injury. Some people who have a seizure never have another one. People who have repeated seizures have a condition called epilepsy. What are the symptoms of a seizure?  Symptoms of a seizure vary greatly from person to person. They include:  Convulsions.  Stiffening of the body.  Involuntary movements of the arms or legs.  Loss of consciousness.  Breathing problems.  Falling suddenly.  Confusion.  Head nodding.  Eye blinking or fluttering.  Lip smacking.  Drooling.  Rapid eye movements.  Grunting.  Loss of bladder control and bowel control.  Staring.  Unresponsiveness. Some people have symptoms right before a seizure happens (aura) and right after a seizure happens. Symptoms of an aura include:  Fear or anxiety.  Nausea.  Feeling like the room is spinning (vertigo).  A feeling of having seen or heard something before (deja vu).  Odd tastes or smells.  Changes in vision, such as seeing flashing lights or spots. Symptoms that may follow a seizure include:  Confusion.  Sleepiness.  Headache.  Weakness of one side of the body. Follow  these instructions at home: Medicines    Take over-the-counter and prescription medicines only as told by your health care provider.  Avoid any substances that may prevent your medicine from working properly, such as alcohol. Activity   Do not drive, swim, or do any other activities that would be  dangerous if you had another seizure. Wait until your health care provider approves.  If you live in the [place] check with your local DMV (department of motor vehicles) to find out about the local driving laws. Each state has specific rules about when you can legally return to driving.  Get enough rest. Lack of sleep can make seizures more likely to occur. Educating others  Teach friends and family what to do if you have a seizure. They should:  Lay you on the ground to prevent a fall.  Cushion your head and body.  Loosen any tight clothing around your neck.  Turn you on your side. If vomiting occurs, this helps keep your airway clear.  Stay with you until you recover.  Not hold you down. Holding you down will not stop the seizure.  Not put anything in your mouth.  Know whether or not you need emergency care. General instructions   Contact your health care provider each time you have a seizure.  Avoid anything that has ever triggered a seizure for you.  Keep a seizure diary. Record what you remember about each seizure, especially anything that might have triggered the seizure.  Keep all follow-up visits as told by your health care provider. This is important. Contact a health care provider if:  You have another seizure.  You have seizures more often.  Your seizure symptoms change.  You continue to have seizures with treatment.  You have symptoms of an infection or illness. They might increase your risk of having a seizure. Get help right away if:  You have a seizure:  That lasts longer than 5 minutes.  That is different than previous seizures.  That leaves you unable to speak or use a part of your body.  That makes it harder to breathe.  After a head injury.  You have:  Multiple seizures in a row.  Confusion or a severe headache right after a seizure.  You are having seizures more often.  You do not wake up immediately after a seizure.  You injure  yourself during a seizure. These symptoms may represent a serious problem that is an emergency. Do not wait to see if the symptoms will go away. Get medical help right away. Call your local emergency services (911 in the [place] Do not drive yourself to the hospital. This information is not intended to replace advice given to you by your health care provider. Make sure you discuss any questions you have with your health care provider. Document Released: 12/06/2000 Document Revised: 08/04/2016 Document Reviewed: 07/12/2016 Elsevier Interactive Patient Education  2017 [employer].

## 2017-02-28 NOTE — Progress Notes (Signed)
Patient came to nurses station, states she is ready to go. No discharge orders at this time. Dr. Reesa Chew notified. Per Dr. Reesa Chew, he told patient he would be discharging her around 1 or 2 pm. States her discharge may take 45 minutes. Patient notified. Patient states she is "pissed," states she was told she would be discharged soon, asked to go outside and smoke cigarette. RN told patient it is a smoke free campus and patient went back to her room, stated she was "really pissed." RN went to patient's room, offered shower, television, phone while patient is waiting for discharge. Patient called her mother, stated to her mother, "I have to wait to get out of here and they won't let me smoke a cigarette so now I am in her sitting on my effin ass until the doctor can get my discharge together. He left me to deal with something else."

## 2017-02-28 NOTE — Progress Notes (Signed)
Patient arrived to 5C17 via wheelchair. Patient alert and oriented x 4. Vitals taken and stable. Neuro intact. Oriented to room, phones and call-light. Will continue to monitor.

## 2017-02-28 NOTE — Progress Notes (Signed)
RN discussed discharge instructions with patient including follow up appointments, patient states she has picked a pcp and is following up with them in 1-2 weeks, understands medication indications, route, frequency, dosage. Discharge packet given to patient with discharge instructions and prescriptions.

## 2017-02-28 NOTE — Progress Notes (Signed)
[place] TEAM 1 - Stepdown/ICU TEAM  ROSSLYN PASION  HQI:[id number] DOB: 04/10/1966 DOA: 02/22/2017 PCP: No PCP Per Patient    Brief Narrative:  51 y.o. female with history of anxiety, hypertension, and alcohol abuse who presented to the ED after seizure-like activity at a grocery store. Patient reports 7-8 drinks per day. She reports waking from sleep with tremors which improve after a drink of vodka. She went to the grocery store where she was noted by bystanders to exhibit generalized seizure-like activity. EMS was called and witnessed another episode that they described as a tonic-clonic seizure. She was given 2.5 mg Versed en route to the hospital.   Subjective: Patient denies any complaints this morning and states this is a happy she has been in a very long time.    she is eager to go home today.  Assessment & Plan:  EtOH dependence w/ withdrawal -resolved  At this time her withdrawal symptoms seems to have been resolved  case mahas provided her with a list of substance abuse treatment Center program I have counseled her to refrain from alcohol use   Seizure -Resolved  sMRI without acute findings - most consistent with alcohol withdrawal seizure - canceled EEG as it would not be able to be accomplished at present and is likely not needed  Severe Hypokalemia-improved  Persists - cont to replace and follow   Hypomagnesemia-improved  Supplement again today w/ goal of 2.0  Thrombocytopenia Due to alcoholism  Alcoholic hepatitis  [place] DF score 11.7 on admission - LFTs slowly improving - educate pt on need to entirely abstain from EtOH permanently when she is more oriented   Depression -We will restart home Prozac   DVT prophylaxis: SCDs  Code Status: FULL CODE Family Communication: no family present at time of exam Disposition:  discharged home today in stable condition.   Procedures: None  Antimicrobials:  None  Objective: Blood pressure (!) 140/92, pulse  79, temperature 98.4 F (36.9 C), temperature source Oral, resp. rate 19, height 5\' 5"  (1.651 m), weight 79.8 kg (176 lb), SpO2 95 %.  Intake/Output Summary (Last 24 hours) at 02/28/17 1246 Last data filed at 02/28/17 0450  Gross per 24 hour  Intake             1060 ml  Output              850 ml  Net              210 ml   Filed Weights   02/26/17 0432 02/27/17 0400 02/28/17 0611  Weight: 79.5 kg (175 lb 4.3 oz) 79 kg (174 lb 2.6 oz) 79.8 kg (176 lb)    Examination: General: No acute respiratory distress - alert and pleasant  Lungs: Clear to auscultation bilaterally  Cardiovascular: Regular rate and rhythm without murmur or rub  Abdomen: Nontender, nondistended, soft, bowel sounds positive Extremities: No edema bilateral lower extremities  CBC:  Recent Labs Lab 02/22/17 1626 02/23/17 0231  WBC 3.9* 4.4  NEUTROABS 2.6 2.6  HGB 15.8* 13.0  HCT 45.1 37.6  MCV 102.3* 103.9*  PLT 108* 91*   Basic Metabolic Panel:  Recent Labs Lab 02/23/17 0231 02/24/17 0917 02/25/17 0321 02/26/17 0240 02/27/17 0319 02/28/17 1000  NA 138 139 141 135 138 138  K 2.5* 3.1* 3.4* 3.1* 3.4* 3.9  CL 102 108 104 101 106 107  CO2 26 28 28 26 25 24   GLUCOSE 100* 104* 78 151* 112* 121*  BUN <5* <  5* 5* 8 7 <5*  CREATININE 0.80 0.62 0.62 0.63 0.56 0.53  CALCIUM 9.3 8.4* 8.8* 8.5* 8.6* 8.9  MG 2.1  --  1.3* 1.7 1.7 1.6*  PHOS  --   --  2.7  --   --   --    GFR: Estimated Creatinine Clearance: 89.7 mL/min (by C-G formula based on SCr of 0.53 mg/dL).  Liver Function Tests:  Recent Labs Lab 02/22/17 1626 02/23/17 0231 02/26/17 0240 02/27/17 0319  AST 257* 212* 157* 110*  ALT 107* 88* 82* 71*  ALKPHOS 121 105 101 95  BILITOT 3.0* 3.8* 1.8* 1.0  PROT 6.9 5.8* 5.3* 5.4*  ALBUMIN 3.5 2.9* 2.5* 2.5*    Coagulation Profile:  Recent Labs Lab 02/22/17 1947  INR 1.17    CBG:  Recent Labs Lab 02/23/17 0848  GLUCAP 85    Recent Results (from the past 240 hour(s))  MRSA PCR  Screening     Status: None   Collection Time: 02/23/17  2:20 AM  Result Value Ref Range Status   MRSA by PCR NEGATIVE NEGATIVE Final    Comment:        The GeneXpert MRSA Assay (FDA approved for NASAL specimens only), is one component of a comprehensive MRSA colonization surveillance program. It is not intended to diagnose MRSA infection nor to guide or monitor treatment for MRSA infections.      Scheduled Meds: . cloNIDine  0.2 mg Transdermal Weekly  . folic acid  1 mg Oral Daily  . multivitamin with minerals  1 tablet Oral Daily  . nicotine  14 mg Transdermal Daily  . sodium chloride flush  3 mL Intravenous Q12H      LOS: 6 days   Gerlean Ren MD [facility] Office  [phone] Pager - Text Page per Shea Evans as per below:  On-Call/Text Page:      [email or web address]      password [email or web address]  If 7PM-7AM, please contact night-coverage [email or web address] Password [email or web address] 02/28/2017, 12:46 PM

## 2017-05-12 ENCOUNTER — Inpatient Hospital Stay (HOSPITAL_COMMUNITY)
Admission: EM | Admit: 2017-05-12 | Discharge: 2017-05-15 | DRG: 897 | Disposition: A | Discharge status: Home / Self Care | Payer: BLUE CROSS/BLUE SHIELD | Attending: Internal Medicine | Admitting: Internal Medicine

## 2017-05-12 ENCOUNTER — Encounter (HOSPITAL_COMMUNITY): Payer: Self-pay | Admitting: *Deleted

## 2017-05-12 ENCOUNTER — Emergency Department (HOSPITAL_COMMUNITY): Payer: BLUE CROSS/BLUE SHIELD

## 2017-05-12 DIAGNOSIS — E872 Acidosis, unspecified: Secondary | ICD-10-CM

## 2017-05-12 DIAGNOSIS — E860 Dehydration: Secondary | ICD-10-CM | POA: Diagnosis present

## 2017-05-12 DIAGNOSIS — F419 Anxiety disorder, unspecified: Secondary | ICD-10-CM | POA: Diagnosis present

## 2017-05-12 DIAGNOSIS — E876 Hypokalemia: Secondary | ICD-10-CM | POA: Diagnosis present

## 2017-05-12 DIAGNOSIS — K2920 Alcoholic gastritis without bleeding: Secondary | ICD-10-CM | POA: Diagnosis present

## 2017-05-12 DIAGNOSIS — F10939 Alcohol use, unspecified with withdrawal, unspecified: Secondary | ICD-10-CM | POA: Diagnosis present

## 2017-05-12 DIAGNOSIS — F10239 Alcohol dependence with withdrawal, unspecified: Secondary | ICD-10-CM | POA: Diagnosis present

## 2017-05-12 DIAGNOSIS — Z885 Allergy status to narcotic agent status: Secondary | ICD-10-CM | POA: Diagnosis not present

## 2017-05-12 DIAGNOSIS — D649 Anemia, unspecified: Secondary | ICD-10-CM | POA: Diagnosis present

## 2017-05-12 DIAGNOSIS — I10 Essential (primary) hypertension: Secondary | ICD-10-CM | POA: Diagnosis present

## 2017-05-12 DIAGNOSIS — K7010 Alcoholic hepatitis without ascites: Secondary | ICD-10-CM | POA: Diagnosis present

## 2017-05-12 DIAGNOSIS — F17210 Nicotine dependence, cigarettes, uncomplicated: Secondary | ICD-10-CM | POA: Diagnosis present

## 2017-05-12 DIAGNOSIS — F1010 Alcohol abuse, uncomplicated: Secondary | ICD-10-CM

## 2017-05-12 HISTORY — DX: Unspecified convulsions: R56.9

## 2017-05-12 HISTORY — DX: Alcohol abuse, uncomplicated: F10.10

## 2017-05-12 HISTORY — DX: Transient cerebral ischemic attack, unspecified: G45.9

## 2017-05-12 LAB — COMPREHENSIVE METABOLIC PANEL
ALBUMIN: 3.4 g/dL — AB (ref 3.5–5.0)
ALT: 117 U/L — ABNORMAL HIGH (ref 14–54)
AST: 274 U/L — AB (ref 15–41)
Alkaline Phosphatase: 157 U/L — ABNORMAL HIGH (ref 38–126)
Anion gap: 18 — ABNORMAL HIGH (ref 5–15)
BILIRUBIN TOTAL: 2.4 mg/dL — AB (ref 0.3–1.2)
BUN: <5 mg/dL — ABNORMAL LOW (ref 6–20)
CO2: 25 mmol/L (ref 22–32)
Calcium: 10.8 mg/dL — ABNORMAL HIGH (ref 8.9–10.3)
Chloride: 90 mmol/L — ABNORMAL LOW (ref 101–111)
Creatinine, Ser: 0.79 mg/dL (ref 0.44–1.00)
GFR calc Af Amer: >60 mL/min (ref >60)
GFR calc non Af Amer: >60 mL/min (ref >60)
GLUCOSE: 167 mg/dL — AB (ref 65–99)
Potassium: 2.7 mmol/L — CL (ref 3.5–5.1)
Sodium: 133 mmol/L — ABNORMAL LOW (ref 135–145)
TOTAL PROTEIN: 7 g/dL (ref 6.5–8.1)

## 2017-05-12 LAB — PROTIME-INR
INR: 1.21
Prothrombin Time: 15.4 seconds — ABNORMAL HIGH (ref 11.4–15.2)

## 2017-05-12 LAB — CBC WITH DIFFERENTIAL/PLATELET
BASOS ABS: 0 10*3/uL (ref 0.0–0.1)
BASOS PCT: 1 %
Eosinophils Absolute: 0 10*3/uL (ref 0.0–0.7)
Eosinophils Relative: 1 %
HEMATOCRIT: 39.4 % (ref 36.0–46.0)
Hemoglobin: 14 g/dL (ref 12.0–15.0)
Lymphocytes Relative: 19 %
Lymphs Abs: 1.1 10*3/uL (ref 0.7–4.0)
MCH: 36.2 pg — ABNORMAL HIGH (ref 26.0–34.0)
MCHC: 35.5 g/dL (ref 30.0–36.0)
MCV: 101.8 fL — ABNORMAL HIGH (ref 78.0–100.0)
Monocytes Absolute: 0.6 10*3/uL (ref 0.1–1.0)
Monocytes Relative: 10 %
NEUTROS ABS: 4.3 10*3/uL (ref 1.7–7.7)
NEUTROS PCT: 69 %
Platelets: 173 10*3/uL (ref 150–400)
RBC: 3.87 MIL/uL (ref 3.87–5.11)
RDW: 13.6 % (ref 11.5–15.5)
WBC: 6.1 10*3/uL (ref 4.0–10.5)

## 2017-05-12 LAB — URINALYSIS, ROUTINE W REFLEX MICROSCOPIC
BILIRUBIN URINE: NEGATIVE
Glucose, UA: NEGATIVE mg/dL
Hgb urine dipstick: NEGATIVE
KETONES UR: NEGATIVE mg/dL
Leukocytes, UA: NEGATIVE
NITRITE: NEGATIVE
PROTEIN: NEGATIVE mg/dL
SPECIFIC GRAVITY, URINE: 1.002 — AB (ref 1.005–1.030)
pH: 7 (ref 5.0–8.0)

## 2017-05-12 LAB — I-STAT TROPONIN, ED: Troponin i, poc: 0 ng/mL (ref 0.00–0.08)

## 2017-05-12 LAB — MRSA PCR SCREENING: MRSA BY PCR: NEGATIVE

## 2017-05-12 LAB — MAGNESIUM: MAGNESIUM: 0.7 mg/dL — AB (ref 1.7–2.4)

## 2017-05-12 LAB — LIPASE, BLOOD: Lipase: 29 U/L (ref 11–51)

## 2017-05-12 LAB — ETHANOL: Alcohol, Ethyl (B): 73 mg/dL — ABNORMAL HIGH (ref <5)

## 2017-05-12 LAB — I-STAT CG4 LACTIC ACID, ED: LACTIC ACID, VENOUS: 6.49 mmol/L — AB (ref 0.5–1.9)

## 2017-05-12 MED ORDER — LORAZEPAM 2 MG/ML IJ SOLN: 0.0000 mg | Freq: Two times a day (BID) | INTRAMUSCULAR | Status: DC

## 2017-05-12 MED ORDER — MORPHINE SULFATE (PF) 4 MG/ML IV SOLN
4.0000 mg | Freq: Once | INTRAVENOUS | Status: AC
Start: 2017-05-12 — End: 2017-05-12
  Administered 2017-05-12: 4 mg via INTRAVENOUS
  Filled 2017-05-12: qty 1

## 2017-05-12 MED ORDER — MAGNESIUM SULFATE 2 GM/50ML IV SOLN
2.0000 g | Freq: Once | INTRAVENOUS | Status: AC
  Administered 2017-05-12: 2 g via INTRAVENOUS
  Filled 2017-05-12: qty 50

## 2017-05-12 MED ORDER — SODIUM CHLORIDE 0.9 % IV BOLUS (SEPSIS)
2000.0000 mL | Freq: Once | INTRAVENOUS | Status: AC
Start: 2017-05-12 — End: 2017-05-12
  Administered 2017-05-12: 2000 mL via INTRAVENOUS

## 2017-05-12 MED ORDER — ONDANSETRON HCL 4 MG/2ML IJ SOLN: 4.0000 mg | Freq: Four times a day (QID) | INTRAMUSCULAR | Status: DC | PRN

## 2017-05-12 MED ORDER — KETOROLAC TROMETHAMINE 15 MG/ML IJ SOLN: 15.0000 mg | Freq: Four times a day (QID) | INTRAMUSCULAR | Status: DC | PRN

## 2017-05-12 MED ORDER — SODIUM CHLORIDE 0.9 % IV SOLN
INTRAVENOUS | Status: DC
  Administered 2017-05-12: 11:00:00 via INTRAVENOUS

## 2017-05-12 MED ORDER — THIAMINE HCL 100 MG/ML IJ SOLN
100.0000 mg | Freq: Every day | INTRAMUSCULAR | Status: DC
  Administered 2017-05-12 – 2017-05-14 (×3): 100 mg via INTRAVENOUS
  Filled 2017-05-12 (×3): qty 2

## 2017-05-12 MED ORDER — ONDANSETRON HCL 4 MG/2ML IJ SOLN
4.0000 mg | Freq: Once | INTRAMUSCULAR | Status: AC
  Administered 2017-05-12: 4 mg via INTRAVENOUS
  Filled 2017-05-12: qty 2

## 2017-05-12 MED ORDER — SODIUM CHLORIDE 0.9% FLUSH
3.0000 mL | Freq: Two times a day (BID) | INTRAVENOUS | Status: DC
  Administered 2017-05-12 – 2017-05-15 (×5): 3 mL via INTRAVENOUS

## 2017-05-12 MED ORDER — ENOXAPARIN SODIUM 40 MG/0.4ML ~~LOC~~ SOLN
40.0000 mg | SUBCUTANEOUS | Status: DC
  Administered 2017-05-12 – 2017-05-14 (×3): 40 mg via SUBCUTANEOUS
  Filled 2017-05-12 (×3): qty 0.4

## 2017-05-12 MED ORDER — CHLORDIAZEPOXIDE HCL 5 MG PO CAPS
25.0000 mg | ORAL_CAPSULE | Freq: Three times a day (TID) | ORAL | Status: AC
  Administered 2017-05-12 – 2017-05-13 (×3): 25 mg via ORAL
  Filled 2017-05-12 (×4): qty 5

## 2017-05-12 MED ORDER — LORAZEPAM 2 MG/ML IJ SOLN
0.0000 mg | Freq: Four times a day (QID) | INTRAMUSCULAR | Status: DC
  Administered 2017-05-12: 2 mg via INTRAVENOUS
  Filled 2017-05-12: qty 1

## 2017-05-12 MED ORDER — SODIUM CHLORIDE 0.9 % IV SOLN: INTRAVENOUS | Status: DC

## 2017-05-12 MED ORDER — ONDANSETRON HCL 4 MG PO TABS: 4.0000 mg | ORAL_TABLET | Freq: Four times a day (QID) | ORAL | Status: DC | PRN

## 2017-05-12 MED ORDER — PANTOPRAZOLE SODIUM 40 MG PO TBEC
40.0000 mg | DELAYED_RELEASE_TABLET | Freq: Two times a day (BID) | ORAL | Status: DC
  Administered 2017-05-12 – 2017-05-15 (×6): 40 mg via ORAL
  Filled 2017-05-12 (×6): qty 1

## 2017-05-12 MED ORDER — POTASSIUM CHLORIDE IN NACL 40-0.9 MEQ/L-% IV SOLN
INTRAVENOUS | Status: AC
  Administered 2017-05-12: 125 mL/h via INTRAVENOUS
  Filled 2017-05-12 (×3): qty 1000

## 2017-05-12 MED ORDER — IOPAMIDOL (ISOVUE-300) INJECTION 61%
INTRAVENOUS | Status: AC
  Administered 2017-05-12: 100 mL
  Filled 2017-05-12: qty 100

## 2017-05-12 MED ORDER — MAGNESIUM SULFATE 4 GM/100ML IV SOLN
4.0000 g | Freq: Once | INTRAVENOUS | Status: AC
  Administered 2017-05-12: 4 g via INTRAVENOUS
  Filled 2017-05-12: qty 100

## 2017-05-12 MED ORDER — POTASSIUM CHLORIDE 10 MEQ/100ML IV SOLN
10.0000 meq | Freq: Once | INTRAVENOUS | Status: AC
  Administered 2017-05-12: 10 meq via INTRAVENOUS
  Filled 2017-05-12: qty 100

## 2017-05-12 MED ORDER — LORAZEPAM 2 MG/ML IJ SOLN
2.0000 mg | INTRAMUSCULAR | Status: DC | PRN
  Administered 2017-05-13 – 2017-05-14 (×3): 2 mg via INTRAVENOUS
  Filled 2017-05-12 (×3): qty 1

## 2017-05-12 MED ORDER — FOLIC ACID 5 MG/ML IJ SOLN
1.0000 mg | Freq: Every day | INTRAMUSCULAR | Status: DC
  Administered 2017-05-12 – 2017-05-14 (×3): 1 mg via INTRAVENOUS
  Filled 2017-05-12 (×3): qty 0.2

## 2017-05-12 NOTE — ED Provider Notes (Signed)
[facility] DEPT Provider Note   CSN: [id number] Arrival date & time: 05/12/17  0913     History   Chief Complaint Chief Complaint  Patient presents with  . Withdrawal    etoh    HPI MARISEL TOSTENSON is a 51 y.o. female.  Patient is a 51 year old female with a history of anxiety, alcohol abuse, hypertension presenting today with vomiting, abdominal pain and requesting alcohol detox. Patient states for the last 3-4 days she's had severe upper abdominal pain with vomiting that is 5 out of 10 and sharp in nature. Trying TE makes it worse.. She has not eaten in 4 days but has been drinking vodka heavily. In the last 3 days she has drank up to 20 shots per day. Last drink was just prior to arrival. She has had some minimal diarrhea but denies any blood in her stool or vomitus. She has no chest pain or shortness of breath. No leg swelling. No prior abdominal surgeries. She denies any urinary or vaginal complaints.   The history is provided by the patient.    Past Medical History:  Diagnosis Date  . Anxiety   . ETOH abuse   . Hypertension     Patient Active Problem List   Diagnosis Date Noted  . Seizure ([place]) 02/22/2017  . Anxiety 02/22/2017  . Hypertension 02/22/2017  . Alcohol dependence ([place]) 02/22/2017  . Hypokalemia 02/22/2017  . Alcoholic hepatitis 62/95/2841  . Leukopenia 02/22/2017  . Thrombocytopenia ([place]) 02/22/2017  . Current smoker 02/22/2017  . Alcohol withdrawal syndrome with complication ([place]) 32/44/0102    Past Surgical History:  Procedure Laterality Date  . ANTERIOR CRUCIATE LIGAMENT REPAIR Right     OB History    No data available       Home Medications    Prior to Admission medications   Medication Sig Start Date End Date Taking? Authorizing Provider  Multiple Vitamins-Minerals (MULTI-VITAMIN GUMMIES) CHEW Chew 2 each by mouth daily.   Yes [provider]  FLUoxetine (PROZAC) 20 MG capsule Take 1 capsule (20 mg total) by mouth  daily. Patient not taking: Reported on 05/12/2017 02/28/17   Damita Lack, MD  folic acid (FOLVITE) 1 MG tablet Take 1 tablet (1 mg total) by mouth daily. Patient not taking: Reported on 05/12/2017 03/01/17   Damita Lack, MD  Multiple Vitamin (MULTIVITAMIN WITH MINERALS) TABS tablet Take 1 tablet by mouth daily. Patient not taking: Reported on 05/12/2017 03/01/17   Damita Lack, MD  naltrexone (DEPADE) 50 MG tablet Take 1 tablet (50 mg total) by mouth daily. Patient not taking: Reported on 05/12/2017 02/28/17   Damita Lack, MD  nicotine (NICODERM CQ - DOSED IN MG/24 HOURS) 14 mg/24hr patch Place 1 patch (14 mg total) onto the skin daily. Patient not taking: Reported on 05/12/2017 03/01/17   Damita Lack, MD    Family History No family history on file.  Social History Social History  Substance Use Topics  . Smoking status: Current Every Day Smoker    Packs/day: 0.50    Years: 32.00    Types: Cigarettes  . Smokeless tobacco: Never Used  . Alcohol use 12.0 oz/week    20 Shots of liquor per week     Allergies   Codeine   Review of Systems Review of Systems  All other systems reviewed and are negative.    Physical Exam Updated Vital Signs BP (!) 155/110   Pulse (!) 121   Temp 98.7 F (37.1 C) (  Oral)   Resp 10   Ht 5\' 6"  (1.676 m)   Wt 170 lb (77.1 kg)   SpO2 100%   BMI 27.44 kg/m   Physical Exam  Constitutional: She is oriented to person, place, and time. She appears well-developed and well-nourished. No distress.  HENT:  Head: Normocephalic and atraumatic.  Mouth/Throat: Oropharynx is clear and moist. Mucous membranes are dry.  Eyes: Conjunctivae and EOM are normal. Pupils are equal, round, and reactive to light.  Neck: Normal range of motion. Neck supple.  Cardiovascular: Regular rhythm and intact distal pulses.  Tachycardia present.   No murmur heard. Pulmonary/Chest: Effort normal and breath sounds normal. No respiratory distress. She has  no wheezes. She has no rales.  Abdominal: Soft. She exhibits no distension. There is tenderness in the left upper quadrant and left lower quadrant. There is guarding. There is no rebound.  Musculoskeletal: Normal range of motion. She exhibits no edema or tenderness.  Neurological: She is alert and oriented to person, place, and time.  Skin: Skin is warm and dry. No rash noted. No erythema.  Psychiatric: She has a normal mood and affect. Her behavior is normal.  No suicidal thoughts  Nursing note and vitals reviewed.    ED Treatments / Results  Labs (all labs ordered are listed, but only abnormal results are displayed) Labs Reviewed  CBC WITH DIFFERENTIAL/PLATELET - Abnormal; Notable for the following:       Result Value   MCV 101.8 (*)    MCH 36.2 (*)    All other components within normal limits  COMPREHENSIVE METABOLIC PANEL - Abnormal; Notable for the following:    Sodium 133 (*)    Potassium 2.7 (*)    Chloride 90 (*)    Glucose, Bld 167 (*)    BUN <5 (*)    Calcium 10.8 (*)    Albumin 3.4 (*)    AST 274 (*)    ALT 117 (*)    Alkaline Phosphatase 157 (*)    Total Bilirubin 2.4 (*)    Anion gap 18 (*)    All other components within normal limits  ETHANOL - Abnormal; Notable for the following:    Alcohol, Ethyl (B) 73 (*)    All other components within normal limits  URINALYSIS, ROUTINE W REFLEX MICROSCOPIC - Abnormal; Notable for the following:    Specific Gravity, Urine 1.002 (*)    All other components within normal limits  MAGNESIUM - Abnormal; Notable for the following:    Magnesium 0.7 (*)    All other components within normal limits  I-STAT CG4 LACTIC ACID, ED - Abnormal; Notable for the following:    Lactic Acid, Venous 6.49 (*)    All other components within normal limits  LIPASE, BLOOD  I-STAT TROPOININ, ED    EKG  EKG Interpretation  Date/Time:  Monday May 12 2017 09:32:38 EDT Ventricular Rate:  140 PR Interval:    QRS Duration: 55 QT  Interval:  307 QTC Calculation: 469 R Axis:   47 Text Interpretation:  Sinus tachycardia Multiple premature complexes, vent & supraven LAE, consider biatrial enlargement Borderline T abnormalities, diffuse leads No significant change since last tracing Confirmed by Blanchie Dessert [phone]) on 05/12/2017 12:06:33 PM Also confirmed by Blanchie Dessert [phone]), editor Drema Pry [phone])  on 05/12/2017 12:49:21 PM       Radiology Ct Abdomen Pelvis W Contrast  Result Date: 05/12/2017 CLINICAL DATA:  Left lower quadrant pain with nausea vomiting 1 week EXAM: CT ABDOMEN  AND PELVIS WITH CONTRAST TECHNIQUE: Multidetector CT imaging of the abdomen and pelvis was performed using the standard protocol following bolus administration of intravenous contrast. CONTRAST:  < 100 mL > ISOVUE-300 IOPAMIDOL (ISOVUE-300) INJECTION 61% COMPARISON:  None. FINDINGS: Lower chest: Lung bases clear Hepatobiliary: Low-density liver diffusely compatible with hepatic steatosis. Gallbladder and bile ducts normal. Pancreas: Negative Spleen: Negative Adrenals/Urinary Tract: Negative for renal mass or obstruction. Negative for renal calculi. Normal urinary bladder. Stomach/Bowel: Normal stomach. Negative for bowel obstruction. Negative for bowel mass or edema. Negative for diverticulitis. Normal appendix. Vascular/Lymphatic: Mild atherosclerotic disease. Negative for aortic aneurysm. No adenopathy Reproductive: IUD within the fundus of the uterus . 3 cm soft tissue mass anterior to the fundus of the uterus compatible with uterine fibroid. No adnexal mass. Other: Negative for free fluid. Musculoskeletal: Mild lumbar disc degeneration. No acute skeletal abnormality. IMPRESSION: No cause for acute abdominal pain identified Hepatic steatosis 3 cm uterine fibroid Electronically Signed   By: Franchot Gallo M.D.   On: 05/12/2017 12:54    Procedures Procedures (including critical care time)  Medications Ordered in ED Medications   0.9 %  sodium chloride infusion ( Intravenous New Bag/Given 05/12/17 1116)  magnesium sulfate IVPB 2 g 50 mL (not administered)  LORazepam (ATIVAN) injection 0-4 mg (not administered)    Followed by  LORazepam (ATIVAN) injection 0-4 mg (not administered)  ondansetron (ZOFRAN) injection 4 mg (4 mg Intravenous Given 05/12/17 1002)  morphine 4 MG/ML injection 4 mg (4 mg Intravenous Given 05/12/17 1003)  sodium chloride 0.9 % bolus 2,000 mL (0 mLs Intravenous Stopped 05/12/17 1102)  potassium chloride 10 mEq in 100 mL IVPB (0 mEq Intravenous Stopped 05/12/17 1220)  iopamidol (ISOVUE-300) 61 % injection (100 mLs  Contrast Given 05/12/17 1231)     Initial Impression / Assessment and Plan / ED Course  I have reviewed the triage vital signs and the nursing notes.  Pertinent labs & imaging results that were available during my care of the patient were reviewed by me and considered in my medical decision making (see chart for details).     Patient is a 51 year old female presenting today requesting alcohol detox however for the last 3-4 days she's had abdominal pain and vomiting. Pain is mostly located on the left side of her abdomen. She also has significant sinus tachycardia initially in the 150s which is improving with IV fluids. Patient has been drinking heavily for the last 3-4 days.  She has no evidence of fluid overload and denies any chest pain or shortness of breath. Concern for possible pancreatitis, diverticulitis or perforation. Also concern for severe dehydration. Patient displays no signs of alcohol withdrawal at this time. Patient given IV fluids. Lactate elevated at 6.4. CBC without acute findings. CMP, lipase, EtOH and UA pending. Patient will most likely need a CT to further evaluate. She was given pain and nausea control.  1:25 PM CT without acute findings.  Patient CIWA score now at 19 and she is starting to vomit. Patient given Ativan. Also low magnesium which was replaced as well as  low potassium. LFTs are elevated however most likely related to excessive alcohol use. We'll continue to hydrate and admit for further care.  Final Clinical Impressions(s) / ED Diagnoses   Final diagnoses:  Alcohol abuse  Dehydration  Alcohol withdrawal syndrome with complication (HCC)  Hypokalemia  Hypomagnesemia    New Prescriptions New Prescriptions   No medications on file     Blanchie Dessert, MD 05/12/17 1327

## 2017-05-12 NOTE — ED Notes (Addendum)
Pt resting comfortably.  Advised pt she could have something more than ice chips once her CT scan comes back ok per Dr Maryan Rued.

## 2017-05-12 NOTE — ED Notes (Signed)
Patient transported to CT 

## 2017-05-12 NOTE — ED Notes (Signed)
Patient is stable and ready to be transport to the floor at this time.  Report was called to 4E RN.  Belongings taken with the patient to the floor.   

## 2017-05-12 NOTE — H&P (Signed)
Date: 05/12/2017               Patient Name:  Cynthia Schroeder MRN: [id number]  DOB: 28-Feb-1966 Age / Sex: 51 y.o., female   PCP: Patient, No Pcp Per         Medical Service: Internal Medicine Teaching Service         Attending Physician: Dr. Aldine Contes, MD    First Contact: Dr. Inda Castle Pager: [phone]  Second Contact: Dr. Benjamine Mola Pager: [phone]       After Hours (After 5p/  First Contact Pager: [phone]  weekends / holidays): Second Contact Pager: [phone]   Chief Complaint: Abdominal pain and nausea  History of Present Illness: Cynthia Schroeder is a 51 y.o. female with history of HTN and alcohol dependence and prior alcohol withdrawal complicated by seizures who presents with nausea, vomiting, and abdominal pain.  She presents with 3 days of worsening gait instability, dizziness, generalized weakness, and nausea and vomiting in the context of increased alcohol consumption.  She had been drinking around 10 miniature bottles of liquor daily for months to years, but increased her drinking to about 15 bottles in the last several days. She said she was drinking more because of social stressors associated with Mother's Day and a poor relationship with her stepmother. In these last few days she's been vomiting between 10 and 15 times a day due to persistent nausea. The emesis is mostly clear, tinged with the color of alcohol she's been drinking, and had one episode of a small amount of blood today.  She thinks she's had some darker brown, but not black, stools and diarrhea in the last few days. She has also not been eating or drinking much in this period of time. Her last drink was at around 8 AM this morning.  She had one prior detox which was done at home with home nursing supervision. In March of this year she was admitted for a week for alcohol withdrawal following an episode of seizure-like activity  She has been having worsening abdominal pain for the past week has been taking  over-the-counter Pepcid for reflux symptoms.  Denies chest pain, dyspnea, fevers, chills, or other symptoms.  Meds:  Current Meds  Medication Sig  . Multiple Vitamins-Minerals (MULTI-VITAMIN GUMMIES) CHEW Chew 2 each by mouth daily.     Allergies: Allergies as of 05/12/2017 - Review Complete 05/12/2017  Allergen Reaction Noted  . Codeine  02/22/2017   Past Medical History:  Diagnosis Date  . Anxiety   . ETOH abuse   . Hypertension     Family History: Father, paternal grandfather, and maternal grandmother with alcohol abuse.  No CAD or CVAs.  Social History:  Moved to [place] 6 months ago to take care of her sick mother Chronic alcohol abuse for 20+ years Currently smoking 2-3 cigarettes daily, ~25 pack year history No illicit drugs  Review of Systems: A complete ROS was negative except as per HPI.  Physical Exam: Blood pressure (!) 162/119, pulse (!) 134, temperature 98.7 F (37.1 C), temperature source Oral, resp. rate 14, height 5\' 6"  (1.676 m), weight 170 lb (77.1 kg), SpO2 96 %.  Physical Exam  Constitutional: She is oriented to person, place, and time. She appears well-developed and well-nourished. No distress.  HENT:  Head: Normocephalic and atraumatic.  Eyes: Conjunctivae are normal. No scleral icterus.  Neck: Normal range of motion. Neck supple.  Cardiovascular: Regular rhythm and normal heart sounds.   Tachycardic  Pulmonary/Chest: Effort normal and breath sounds normal. No respiratory distress. She has no rales.  Abdominal: Soft. She exhibits no distension.  Diffuse abdominal tenderness to palpation, most epigastric and LLQ  Musculoskeletal: She exhibits no edema or tenderness.  Neurological: She is alert and oriented to person, place, and time.  No tremor  Skin: Skin is warm and dry.  Psychiatric: She has a normal mood and affect. Her behavior is normal.  No audio, visual, or tactile hallucinations    CBC Latest Ref Rng & Units 05/12/2017 02/23/2017  02/22/2017  WBC 4.0 - 10.5 K/uL 6.1 4.4 3.9(L)  Hemoglobin 12.0 - 15.0 g/dL 14.0 13.0 15.8(H)  Hematocrit 36.0 - 46.0 % 39.4 37.6 45.1  Platelets 150 - 400 K/uL 173 91(L) 108(L)   CMP Latest Ref Rng & Units 05/12/2017 02/28/2017 02/27/2017  Glucose 65 - 99 mg/dL 167(H) 121(H) 112(H)  BUN 6 - 20 mg/dL <5(L) <5(L) 7  Creatinine 0.44 - 1.00 mg/dL 0.79 0.53 0.56  Sodium 135 - 145 mmol/L 133(L) 138 138  Potassium 3.5 - 5.1 mmol/L 2.7(LL) 3.9 3.4(L)  Chloride 101 - 111 mmol/L 90(L) 107 106  CO2 22 - 32 mmol/L 25 24 25   Calcium 8.9 - 10.3 mg/dL 10.8(H) 8.9 8.6(L)  Total Protein 6.5 - 8.1 g/dL 7.0 - 5.4(L)  Total Bilirubin 0.3 - 1.2 mg/dL 2.4(H) - 1.0  Alkaline Phos 38 - 126 U/L 157(H) - 95  AST 15 - 41 U/L 274(H) - 110(H)  ALT 14 - 54 U/L 117(H) - 71(H)   Mg 0.7  Component     Latest Ref Rng & Units 05/12/2017  Alcohol, Ethyl (B)     <5 mg/dL 73 (H)   Lipase     Component Value Date/Time   LIPASE 29 05/12/2017 0941   Lactic Acid, Venous    Component Value Date/Time   LATICACIDVEN 6.49 (HH) 05/12/2017 1007     Assessment & Plan by Problem: Active Problems:   Alcohol withdrawal ([place])   51 y.o. female with alcohol dependence And possible history of withdrawal seizures who presents in alcohol withdrawal. She says she wishes to quit drinking. This presentation her symptoms are tremor, nausea and vomiting, anxiety, tachycardia and hypertension, without hallucinations, seizures, or altered mental status. With her heavy alcohol consumption and history of seizures she is at moderate risk of competitions. Will manage with scheduled and PRN oral benzodiazepines.  #Alcohol Dependence #Alcohol Withdrawal History of possible withdrawal seizures.  No hallucinosis, DTs, or seizures this admission.  Last drink 5/21 AM -Chlordiazepoxide 25 mg TID -CIWA with PRN lorazepam -Zofran PRN -Thiamine, folate, multivitamin  #Abdominal Pain #Alcoholic Hepatitis Likely alcoholic hepatitis, gastritis,  and withdrawal. Lipase is normal, doubt acute pancreatitis. -BID PPI -FOBT  #Hypomagnesemia #Hypokalemia -Telemetry -Replete with IV Mg and K -Repeat EKG in AM  Fluids: NS 125 mL/hr Diet: regular DVT Prophylaxis: lovenox Code Status: full  Dispo: Admit patient to Inpatient with expected length of stay greater than 2 midnights.  Signed: Minus Liberty, MD 05/12/2017, 2:17 PM  Pager: [phone]

## 2017-05-12 NOTE — ED Notes (Signed)
Notified Dr Maryan Rued of critical mag 0.7.

## 2017-05-12 NOTE — ED Triage Notes (Signed)
PT here via [facility] from home for etoh withdrawal.  Was hospitalized with seizures after attempting to quit 1 month ago.  States has been trending down her drinking, but this last week has been feeling worse.  Has been drinking 20 shots of vodka per day for last 2 days.  Last shot of vodka was 0900. HR 145, Sats 97%, bp 170/120.  Given 500 ml ns which brought hr from 1501's to 120's.

## 2017-05-13 LAB — COMPREHENSIVE METABOLIC PANEL
ALBUMIN: 2.6 g/dL — AB (ref 3.5–5.0)
ALK PHOS: 118 U/L (ref 38–126)
ALT: 78 U/L — ABNORMAL HIGH (ref 14–54)
ANION GAP: 8 (ref 5–15)
AST: 203 U/L — ABNORMAL HIGH (ref 15–41)
BUN: <5 mg/dL — ABNORMAL LOW (ref 6–20)
CO2: 28 mmol/L (ref 22–32)
Calcium: 7.8 mg/dL — ABNORMAL LOW (ref 8.9–10.3)
Chloride: 100 mmol/L — ABNORMAL LOW (ref 101–111)
Creatinine, Ser: 0.76 mg/dL (ref 0.44–1.00)
GFR calc Af Amer: >60 mL/min (ref >60)
GFR calc non Af Amer: >60 mL/min (ref >60)
Glucose, Bld: 127 mg/dL — ABNORMAL HIGH (ref 65–99)
Potassium: 3.1 mmol/L — ABNORMAL LOW (ref 3.5–5.1)
SODIUM: 136 mmol/L (ref 135–145)
TOTAL PROTEIN: 5.6 g/dL — AB (ref 6.5–8.1)
Total Bilirubin: 2.9 mg/dL — ABNORMAL HIGH (ref 0.3–1.2)

## 2017-05-13 LAB — CBC
HCT: 31.5 % — ABNORMAL LOW (ref 36.0–46.0)
HEMOGLOBIN: 10.6 g/dL — AB (ref 12.0–15.0)
MCH: 35.6 pg — ABNORMAL HIGH (ref 26.0–34.0)
MCHC: 33.7 g/dL (ref 30.0–36.0)
MCV: 105.7 fL — ABNORMAL HIGH (ref 78.0–100.0)
Platelets: 133 10*3/uL — ABNORMAL LOW (ref 150–400)
RBC: 2.98 MIL/uL — AB (ref 3.87–5.11)
RDW: 13.9 % (ref 11.5–15.5)
WBC: 4.1 10*3/uL (ref 4.0–10.5)

## 2017-05-13 LAB — LACTIC ACID, PLASMA: LACTIC ACID, VENOUS: 2.4 mmol/L — AB (ref 0.5–1.9)

## 2017-05-13 LAB — MAGNESIUM: MAGNESIUM: 1.6 mg/dL — AB (ref 1.7–2.4)

## 2017-05-13 MED ORDER — MAGNESIUM SULFATE 2 GM/50ML IV SOLN
2.0000 g | Freq: Once | INTRAVENOUS | Status: AC
  Administered 2017-05-13: 2 g via INTRAVENOUS
  Filled 2017-05-13: qty 50

## 2017-05-13 NOTE — Care Management Note (Addendum)
Case Management Note  Patient Details  Name: Cynthia Schroeder MRN: [id number] Date of Birth: 26-Feb-1966  Subjective/Objective:    Presents with abdominal pain and was found to have acute alcohol withdrawal with tachycardia and tremors noted in both hands with heavy alcohol consumption.  Has [employer].    No PCP listed - Patient has follow up with [facility]              Action/Plan: NCM will follow for dc needs.  Expected Discharge Date:                  Expected Discharge Plan:     In-House Referral:     Discharge planning Services  CM Consult  Post Acute Care Choice:    Choice offered to:     DME Arranged:    DME Agency:     HH Arranged:    HH Agency:     Status of Service:  In process, will continue to follow  If discussed at Long Length of Stay Meetings, dates discussed:    Additional Comments:  Zenon Mayo, RN 05/13/2017, 2:52 PM

## 2017-05-13 NOTE — Progress Notes (Signed)
   Subjective: Feels better today, with some anxiety and tremors but no hallucinations, seizures, or confusion.  Up and walking around the room with assistance.  Hungry and wants to try eating.  Objective:  Vital signs in last 24 hours: Vitals:   05/13/17 0800 05/13/17 0840 05/13/17 0900 05/13/17 1200  BP:  (!) 145/95  (!) 150/94  Pulse: (!) 103 (!) 101 (!) 108 (!) 111  Resp: 16 20    Temp:  98.5 F (36.9 C)    TempSrc:  Oral    SpO2: 98% 99%    Weight:      Height:       Physical Exam  Constitutional: She is oriented to person, place, and time. She appears well-developed and well-nourished. No distress.  Cardiovascular: Regular rhythm and normal heart sounds.   Tachycardic  Pulmonary/Chest: Effort normal and breath sounds normal.  Neurological: She is alert and oriented to person, place, and time.  Mild postural tremor in bilateral hands and tongue  Skin: Skin is warm and dry.  Psychiatric: She has a normal mood and affect. Her behavior is normal.   CBC Latest Ref Rng & Units 05/13/2017 05/12/2017 02/23/2017  WBC 4.0 - 10.5 K/uL 4.1 6.1 4.4  Hemoglobin 12.0 - 15.0 g/dL 10.6(L) 14.0 13.0  Hematocrit 36.0 - 46.0 % 31.5(L) 39.4 37.6  Platelets 150 - 400 K/uL 133(L) 173 91(L)   CMP Latest Ref Rng & Units 05/13/2017 05/12/2017 02/28/2017  Glucose 65 - 99 mg/dL 127(H) 167(H) 121(H)  BUN 6 - 20 mg/dL <5(L) <5(L) <5(L)  Creatinine 0.44 - 1.00 mg/dL 0.76 0.79 0.53  Sodium 135 - 145 mmol/L 136 133(L) 138  Potassium 3.5 - 5.1 mmol/L 3.1(L) 2.7(LL) 3.9  Chloride 101 - 111 mmol/L 100(L) 90(L) 107  CO2 22 - 32 mmol/L 28 25 24   Calcium 8.9 - 10.3 mg/dL 7.8(L) 10.8(H) 8.9  Total Protein 6.5 - 8.1 g/dL 5.6(L) 7.0 -  Total Bilirubin 0.3 - 1.2 mg/dL 2.9(H) 2.4(H) -  Alkaline Phos 38 - 126 U/L 118 157(H) -  AST 15 - 41 U/L 203(H) 274(H) -  ALT 14 - 54 U/L 78(H) 117(H) -     Assessment/Plan:  Active Problems:   Alcohol withdrawal ([place])  51 y.o. female with alcohol dependence who presents  with alcohol withdrawal and abdominal pain.  With her heavy alcohol consumption and history of possible withdrawal seizure she is at moderate risk of serious complication, though so far her symptoms this admission have been limited to hypertension, tachycardia, anxiety, and tremor with benzodiazepines.  Her abdominal pain most likely represents alcoholic gastritis and/or chronic alcoholic hepatitis.  #Alcohol Dependence #Alcohol Withdrawal History of possible withdrawal seizures.  No hallucinosis, DTs, or seizures this admission.  Last drink the morning of 5/21, mau -Continue Chlordiazepoxide 25 mg TID today, plan to taper tomorrow -CIWA with PRN IV lorazepam -Zofran PRN -Thiamine, folate, multivitamin  #Abdominal Pain #Alcoholic Hepatitis Likely alcoholic hepatitis, gastritis, and withdrawal. Lipase is normal, doubt acute pancreatitis. -BID PPI -FOBT  #Hypomagnesemia #Hypokalemia Improving with continued IV repletion. -Telemetry -Replete with IV Mg and K -Follow electrolytes  Fluids: NS w/ K 125 mL/hr Diet: regular DVT Prophylaxis: lovenox Code Status: full  Dispo: Anticipated discharge in approximately 1-2 day(s).   Minus Liberty, MD 05/13/2017, 12:46 PM Pager: [phone]

## 2017-05-13 NOTE — Progress Notes (Signed)
CRITICAL VALUE ALERT  Critical value received: Lactic Acid 2.4  Date of notification:  05/13/17  Time of notification: 4383  Critical value read back:Yes.    Nurse who received alert:  Newman Nickels RN  MD aware no new interventions.

## 2017-05-14 DIAGNOSIS — E860 Dehydration: Secondary | ICD-10-CM

## 2017-05-14 DIAGNOSIS — E872 Acidosis, unspecified: Secondary | ICD-10-CM

## 2017-05-14 LAB — HEPATIC FUNCTION PANEL
ALBUMIN: 2.6 g/dL — AB (ref 3.5–5.0)
ALT: 62 U/L — ABNORMAL HIGH (ref 14–54)
AST: 140 U/L — AB (ref 15–41)
Alkaline Phosphatase: 110 U/L (ref 38–126)
BILIRUBIN DIRECT: 1.5 mg/dL — AB (ref 0.1–0.5)
Indirect Bilirubin: 1.5 mg/dL — ABNORMAL HIGH (ref 0.3–0.9)
Total Bilirubin: 3 mg/dL — ABNORMAL HIGH (ref 0.3–1.2)
Total Protein: 5.7 g/dL — ABNORMAL LOW (ref 6.5–8.1)

## 2017-05-14 LAB — CBC
HCT: 32.7 % — ABNORMAL LOW (ref 36.0–46.0)
Hemoglobin: 10.9 g/dL — ABNORMAL LOW (ref 12.0–15.0)
MCH: 35.3 pg — ABNORMAL HIGH (ref 26.0–34.0)
MCHC: 33.3 g/dL (ref 30.0–36.0)
MCV: 105.8 fL — ABNORMAL HIGH (ref 78.0–100.0)
PLATELETS: 130 10*3/uL — AB (ref 150–400)
RBC: 3.09 MIL/uL — AB (ref 3.87–5.11)
RDW: 13.8 % (ref 11.5–15.5)
WBC: 5.7 10*3/uL (ref 4.0–10.5)

## 2017-05-14 LAB — BASIC METABOLIC PANEL
ANION GAP: 10 (ref 5–15)
BUN: <5 mg/dL — ABNORMAL LOW (ref 6–20)
CALCIUM: 7.3 mg/dL — AB (ref 8.9–10.3)
CO2: 24 mmol/L (ref 22–32)
Chloride: 101 mmol/L (ref 101–111)
Creatinine, Ser: 0.65 mg/dL (ref 0.44–1.00)
GFR calc Af Amer: >60 mL/min (ref >60)
Glucose, Bld: 122 mg/dL — ABNORMAL HIGH (ref 65–99)
Potassium: 3 mmol/L — ABNORMAL LOW (ref 3.5–5.1)
Sodium: 135 mmol/L (ref 135–145)

## 2017-05-14 LAB — MAGNESIUM: Magnesium: 1.4 mg/dL — ABNORMAL LOW (ref 1.7–2.4)

## 2017-05-14 LAB — LACTIC ACID, PLASMA: LACTIC ACID, VENOUS: 2.6 mmol/L — AB (ref 0.5–1.9)

## 2017-05-14 MED ORDER — ADULT MULTIVITAMIN W/MINERALS CH
1.0000 | ORAL_TABLET | Freq: Every day | ORAL | Status: DC
  Administered 2017-05-14 – 2017-05-15 (×2): 1 via ORAL
  Filled 2017-05-14 (×2): qty 1

## 2017-05-14 MED ORDER — LORAZEPAM 1 MG PO TABS: 1.0000 mg | ORAL_TABLET | Freq: Four times a day (QID) | ORAL | Status: DC | PRN

## 2017-05-14 MED ORDER — THIAMINE HCL 100 MG/ML IJ SOLN: 100.0000 mg | Freq: Every day | INTRAMUSCULAR | Status: DC

## 2017-05-14 MED ORDER — CHLORDIAZEPOXIDE HCL 25 MG PO CAPS
25.0000 mg | ORAL_CAPSULE | Freq: Two times a day (BID) | ORAL | Status: DC
  Administered 2017-05-14: 25 mg via ORAL
  Filled 2017-05-14: qty 1

## 2017-05-14 MED ORDER — MAGNESIUM SULFATE 50 % IJ SOLN
3.0000 g | Freq: Once | INTRAVENOUS | Status: AC
  Administered 2017-05-14: 3 g via INTRAVENOUS
  Filled 2017-05-14: qty 6

## 2017-05-14 MED ORDER — LORAZEPAM 2 MG/ML IJ SOLN: 1.0000 mg | Freq: Four times a day (QID) | INTRAMUSCULAR | Status: DC | PRN

## 2017-05-14 MED ORDER — VITAMIN B-1 100 MG PO TABS
100.0000 mg | ORAL_TABLET | Freq: Every day | ORAL | Status: DC
  Administered 2017-05-15: 100 mg via ORAL
  Filled 2017-05-14: qty 1

## 2017-05-14 MED ORDER — POTASSIUM CHLORIDE CRYS ER 20 MEQ PO TBCR
30.0000 meq | EXTENDED_RELEASE_TABLET | Freq: Two times a day (BID) | ORAL | Status: AC
  Administered 2017-05-14 (×2): 30 meq via ORAL
  Filled 2017-05-14 (×2): qty 1

## 2017-05-14 MED ORDER — SODIUM CHLORIDE 0.9 % IV BOLUS (SEPSIS)
500.0000 mL | Freq: Once | INTRAVENOUS | Status: AC
  Administered 2017-05-14: 500 mL via INTRAVENOUS

## 2017-05-14 MED ORDER — FOLIC ACID 1 MG PO TABS
1.0000 mg | ORAL_TABLET | Freq: Every day | ORAL | Status: DC
  Administered 2017-05-14 – 2017-05-15 (×2): 1 mg via ORAL
  Filled 2017-05-14 (×2): qty 1

## 2017-05-14 MED ORDER — SODIUM CHLORIDE 0.9 % IV SOLN
INTRAVENOUS | Status: AC
  Administered 2017-05-14: 09:00:00 via INTRAVENOUS
  Filled 2017-05-14: qty 1000

## 2017-05-14 MED ORDER — CHLORDIAZEPOXIDE HCL 25 MG PO CAPS
25.0000 mg | ORAL_CAPSULE | Freq: Every day | ORAL | Status: AC
  Administered 2017-05-15: 25 mg via ORAL
  Filled 2017-05-14: qty 1

## 2017-05-14 NOTE — Progress Notes (Signed)
Report called to [address] [place]. Patient will transport shortly.

## 2017-05-14 NOTE — Progress Notes (Addendum)
CRITICAL VALUE STICKER  CRITICAL VALUE: Lactic acid- 2.6  RECEIVER (on-site recipient of call): Tonya Wantz, [place] NOTIFIED: 05/14/2017 0405  MD NOTIFIED: Wynetta Emery, night intern texted page   TIME OF NOTIFICATION: [phone]

## 2017-05-14 NOTE — Progress Notes (Signed)
Subjective: Slept poorly overnight due to staff disturbing her and noise from monitors.  Mild tremors and anxiety, no hallucinations or seizures.  No dyspnea, chest pain, dysuria, or other complaints.  Objective:  Vital signs in last 24 hours: Vitals:   05/13/17 1926 05/13/17 2312 05/14/17 0243 05/14/17 0813  BP: (!) 138/99 (!) 144/101 (!) 139/93 (!) 149/102  Pulse: (!) 105 (!) 108 (!) 101   Resp: 19 19 18    Temp: 98.4 F (36.9 C) 98.7 F (37.1 C) 100 F (37.8 C) 97.7 F (36.5 C)  TempSrc: Oral Oral Oral Oral  SpO2: 99%  100% 100%  Weight:      Height:       Physical Exam  Constitutional: She is oriented to person, place, and time. She appears well-developed and well-nourished. No distress.  Cardiovascular: Regular rhythm and normal heart sounds.   Tachycardic  Pulmonary/Chest: Effort normal and breath sounds normal.  Neurological: She is alert and oriented to person, place, and time.  Mild postural tremor in bilateral hands and tongue  Skin: Skin is warm and dry.  Psychiatric: She has a normal mood and affect. Her behavior is normal.   CBC Latest Ref Rng & Units 05/14/2017 05/13/2017 05/12/2017  WBC 4.0 - 10.5 K/uL 5.7 4.1 6.1  Hemoglobin 12.0 - 15.0 g/dL 10.9(L) 10.6(L) 14.0  Hematocrit 36.0 - 46.0 % 32.7(L) 31.5(L) 39.4  Platelets 150 - 400 K/uL 130(L) 133(L) 173   CMP Latest Ref Rng & Units 05/14/2017 05/13/2017 05/12/2017  Glucose 65 - 99 mg/dL 122(H) 127(H) 167(H)  BUN 6 - 20 mg/dL <5(L) <5(L) <5(L)  Creatinine 0.44 - 1.00 mg/dL 0.65 0.76 0.79  Sodium 135 - 145 mmol/L 135 136 133(L)  Potassium 3.5 - 5.1 mmol/L 3.0(L) 3.1(L) 2.7(LL)  Chloride 101 - 111 mmol/L 101 100(L) 90(L)  CO2 22 - 32 mmol/L 24 28 25   Calcium 8.9 - 10.3 mg/dL 7.3(L) 7.8(L) 10.8(H)  Total Protein 6.5 - 8.1 g/dL 5.7(L) 5.6(L) 7.0  Total Bilirubin 0.3 - 1.2 mg/dL 3.0(H) 2.9(H) 2.4(H)  Alkaline Phos 38 - 126 U/L 110 118 157(H)  AST 15 - 41 U/L 140(H) 203(H) 274(H)  ALT 14 - 54 U/L 62(H) 78(H)  117(H)     Assessment/Plan:  Active Problems:   Alcohol withdrawal ([facility])   Lactic acidosis  51 y.o. female with alcohol dependence who presents with alcohol withdrawal and abdominal pain.  Her withdrawal has been well controlled with benzodiazepines and her risk of complications is likely decreasing at this point. Her abdominal pain most likely represents alcoholic gastritis and/or chronic alcoholic hepatitis.  #Alcohol Dependence #Alcohol Withdrawal History of possible withdrawal seizures.  No hallucinosis, DTs, or seizures this admission.  Last drink the morning of 5/21. -Continue Chlordiazepoxide 25 mg BID today, once tomorrow then discontinue -CIWA with PRN IV lorazepam -Zofran PRN -Thiamine, folate, multivitamin -Transfer to [place] for substance abuse and follow-up planning  #Abdominal Pain #Alcoholic Hepatitis Resolved.  Likely alcoholic hepatitis, gastritis, and withdrawal. Lipase is normal, doubt acute pancreatitis.  LFTs downtrending with alcohol abstinence. -BID PPI -FOBT  #Hypomagnesemia #Hypokalemia Improving with continued IV repletion. -Telemetry -Replete with IV Mg and K -Follow electrolytes  #Lactic Acidosis Improving.  Lactate elevate to 6 at admission, attributable to hypovolemia form vomiting.  Has now stabilizid in the 2s, she appears euvolemic after IVF, is eating and drinking, no vomiting.  Doubt infection, no meds identified to causes lactic acidosis, LFTs downtrending but impaired hepatic function could slow lactate metabolism. -Continue to  monitor  Fluids: NS w/ 40 mEq K 100 mL/hr for 10 hours Diet: regular DVT Prophylaxis: lovenox Code Status: full  Dispo: Anticipated discharge in approximately 1-2 day(s).   Minus Liberty, MD 05/14/2017, 11:08 AM Pager: [phone]

## 2017-05-14 NOTE — Progress Notes (Signed)
Patient transferred. Oriented to room and call bell/phone in reach. Skin intact. Placed on bed alarm will continue to monitor.

## 2017-05-15 DIAGNOSIS — K2920 Alcoholic gastritis without bleeding: Secondary | ICD-10-CM

## 2017-05-15 DIAGNOSIS — Z885 Allergy status to narcotic agent status: Secondary | ICD-10-CM

## 2017-05-15 LAB — CBC
HCT: 35.3 % — ABNORMAL LOW (ref 36.0–46.0)
Hemoglobin: 11.9 g/dL — ABNORMAL LOW (ref 12.0–15.0)
MCH: 36.5 pg — AB (ref 26.0–34.0)
MCHC: 33.7 g/dL (ref 30.0–36.0)
MCV: 108.3 fL — AB (ref 78.0–100.0)
Platelets: 132 10*3/uL — ABNORMAL LOW (ref 150–400)
RBC: 3.26 MIL/uL — AB (ref 3.87–5.11)
RDW: 14.4 % (ref 11.5–15.5)
WBC: 4.2 10*3/uL (ref 4.0–10.5)

## 2017-05-15 LAB — BASIC METABOLIC PANEL
Anion gap: 9 (ref 5–15)
CHLORIDE: 103 mmol/L (ref 101–111)
CO2: 23 mmol/L (ref 22–32)
CREATININE: 0.55 mg/dL (ref 0.44–1.00)
Calcium: 7.5 mg/dL — ABNORMAL LOW (ref 8.9–10.3)
GFR calc Af Amer: >60 mL/min (ref >60)
GFR calc non Af Amer: >60 mL/min (ref >60)
GLUCOSE: 97 mg/dL (ref 65–99)
POTASSIUM: 3.9 mmol/L (ref 3.5–5.1)
Sodium: 135 mmol/L (ref 135–145)

## 2017-05-15 LAB — MAGNESIUM: Magnesium: 1.8 mg/dL (ref 1.7–2.4)

## 2017-05-15 LAB — LACTIC ACID, PLASMA: Lactic Acid, Venous: 1.7 mmol/L (ref 0.5–1.9)

## 2017-05-15 MED ORDER — NALTREXONE HCL 50 MG PO TABS: 50.0000 mg | ORAL_TABLET | Freq: Every day | ORAL | 0 refills | Status: AC

## 2017-05-15 MED ORDER — FOLIC ACID 1 MG PO TABS: 1.0000 mg | ORAL_TABLET | Freq: Every day | ORAL | 0 refills | Status: AC

## 2017-05-15 MED ORDER — THIAMINE HCL 100 MG PO TABS: 100.0000 mg | ORAL_TABLET | Freq: Every day | ORAL | 0 refills | Status: AC

## 2017-05-15 MED ORDER — PANTOPRAZOLE SODIUM 40 MG PO TBEC: 40.0000 mg | DELAYED_RELEASE_TABLET | Freq: Two times a day (BID) | ORAL | 0 refills | Status: DC

## 2017-05-15 NOTE — Discharge Summary (Signed)
Name: Cynthia Schroeder MRN: [id number] DOB: Jun 14, 1966 51 y.o. PCP: Patient, No Pcp Per  Date of Admission: 05/12/2017  9:13 AM Date of Discharge: 05/15/2017 Attending Physician: Aldine Contes, MD  Discharge Diagnosis:  Principal Problem:   Alcohol withdrawal ([place]) Active Problems:   Hypokalemia   Alcoholic hepatitis   Lactic acidosis   Hypomagnesemia   Dehydration   Alcoholic gastritis   Discharge Medications: Allergies as of 05/15/2017      Reactions   Codeine    Itching       Medication List    STOP taking these medications   multivitamin with minerals Tabs tablet     TAKE these medications   FLUoxetine 20 MG capsule Commonly known as:  PROZAC Take 1 capsule (20 mg total) by mouth daily.   folic acid 1 MG tablet Commonly known as:  FOLVITE Take 1 tablet (1 mg total) by mouth daily.   MULTI-VITAMIN GUMMIES Chew Chew 2 each by mouth daily.   naltrexone 50 MG tablet Commonly known as:  DEPADE Take 1 tablet (50 mg total) by mouth daily.   nicotine 14 mg/24hr patch Commonly known as:  NICODERM CQ - dosed in mg/24 hours Place 1 patch (14 mg total) onto the skin daily.   pantoprazole 40 MG tablet Commonly known as:  PROTONIX Take 1 tablet (40 mg total) by mouth 2 (two) times daily.   thiamine 100 MG tablet Take 1 tablet (100 mg total) by mouth daily. Start taking on:  05/16/2017       Disposition and follow-up:   CynthiaCynthia Schroeder was discharged from [facility] in Good condition.  At the hospital follow up visit please address:  1.  Alcohol Abuse.  Encourage abstinence, refill naltrexone if tolerated and appropriate.  2.  Alcoholic Gastritis.  Prescribed PPI at discharge.  3.  Alcoholic Hepatitis.  Check LFTs.   4.  Anemia.  Check CBC.  Macrocytic, likely from alcohol and/or folate deficiency.  Ask about GI bleeding.  5.  Labs / imaging needed at time of follow-up: CBC, CMP  6.  Pending labs/ test needing follow-up:  none  Follow-up Appointments: Follow-up Information    [facility] Follow up on 06/04/2017.   Why:  10:30 for hospital follow up Contact information: [place] [id number] [place] Hospital Course by problem list: Principal Problem:   Alcohol withdrawal ([place]) Active Problems:   Hypokalemia   Alcoholic hepatitis   Lactic acidosis   Hypomagnesemia   Dehydration   Alcoholic gastritis   1. Alcohol Withdrawal Cynthia Schroeder is a 51 year old woman with history of chronic alcohol use disorder presented with several days of worsening nausea, vomiting, abdominal pain, and dizziness in the context of increased alcohol consumption.  She was found to be tachycardic, hypertensive, hypokalemic, and hypomagnesemic, with agitation and tremors consistent with alcohol withdrawal.  She stated that she did not wish to continue drinking and was admitted for medical management of alcohol withdrawal and correction of her electrolyte abnormalities. She was given benzodiazepines, IV fluids, and electrolyte repletion and withdrawal symptoms resolved with course of several days. Withdrawal was likely precipitated by her frequent vomiting, with up to 15 episodes of emesis daily leading up to admission.  She arranged for inpatient substance use disorder treatment at [facility] treatment center in [place], and was discharged with prescription for one month of naltrexone for alcohol use disorder.  2. Abdominal Pain and Vomiting The abdominal pain and vomiting to admission was most likely caused by alcoholic hepatitis and gastritis in addition to alcohol intoxication. Her LFTs were elevated in a hepatocellular pattern with AST greater than ALT and normalized over the course of admission with alcohol abstinence. She was given empiric PPI treatment for alcoholic gastritis and abdominal pain resolved. She was prescribed PPI for one month at  discharge for empiric treatment of gastritis.  3. Hypokalemia and Hypomagnesemia Electrolytes were monitored and repleted.  4. Anemia Mild, macrocytic in patient with chronic alcohol abuse, likely secondary to alcohol and/or folate deficiency.  Hemoglobin stable, no clinical bleeding, given folate supplements.  5. Lactic Acidosis Lactate was elevated 6 at admission and normalized over several days with IV fluids and improving oral intake. She had no signs or symptoms of infection. Alcoholic hepatitis may have decreased her liver's ability to metabolize lactic acid and slowed clearance of lactate generated hypovolemic state from her vomiting.   Discharge Vitals:   BP (!) 145/98   Pulse (!) 101   Temp 99 F (37.2 C)   Resp 18   Ht 5\' 6"  (1.676 m)   Wt 177 lb (80.3 kg)   SpO2 96%   BMI 28.57 kg/m   Pertinent Labs, Studies, and Procedures:   CBC Latest Ref Rng & Units 05/15/2017 05/14/2017 05/13/2017  WBC 4.0 - 10.5 K/uL 4.2 5.7 4.1  Hemoglobin 12.0 - 15.0 g/dL 11.9(L) 10.9(L) 10.6(L)  Hematocrit 36.0 - 46.0 % 35.3(L) 32.7(L) 31.5(L)  Platelets 150 - 400 K/uL 132(L) 130(L) 133(L)   CMP Latest Ref Rng & Units 05/15/2017 05/14/2017 05/13/2017  Glucose 65 - 99 mg/dL 97 122(H) 127(H)  BUN 6 - 20 mg/dL <5(L) <5(L) <5(L)  Creatinine 0.44 - 1.00 mg/dL 0.55 0.65 0.76  Sodium 135 - 145 mmol/L 135 135 136  Potassium 3.5 - 5.1 mmol/L 3.9 3.0(L) 3.1(L)  Chloride 101 - 111 mmol/L 103 101 100(L)  CO2 22 - 32 mmol/L 23 24 28   Calcium 8.9 - 10.3 mg/dL 7.5(L) 7.3(L) 7.8(L)  Total Protein 6.5 - 8.1 g/dL - 5.7(L) 5.6(L)  Total Bilirubin 0.3 - 1.2 mg/dL - 3.0(H) 2.9(H)  Alkaline Phos 38 - 126 U/L - 110 118  AST 15 - 41 U/L - 140(H) 203(H)  ALT 14 - 54 U/L - 62(H) 78(H)   Component     Latest Ref Rng & Units 05/12/2017 05/13/2017 05/14/2017 05/15/2017  Magnesium     1.7 - 2.4 mg/dL 0.7 (LL) 1.6 (L) 1.4 (L) 1.8   Component     Latest Ref Rng & Units 05/12/2017 05/13/2017 05/14/2017 05/15/2017  Lactic  Acid, Venous     0.5 - 1.9 mmol/L 6.49 (HH) 2.4 (HH) 2.6 (HH) 1.7    Discharge Instructions: Discharge Instructions    Diet - low sodium heart healthy    Complete by:  As directed    Increase activity slowly    Complete by:  As directed      You were admitted to the hospital with alcohol withdrawal and also abdominal pain, nausea, and vomiting which I think was from alcohol damage to your stomach and/or liver (alcoholic gastritis and hepatitis).  We treated your withdrawal symptoms  Please make sure to get treatment for your alcohol use disorder.  I think the residential treatment at [facility] sounds like a great start.  You also need to find a primary care doctor to see regularly when you get back from [place].  I have prescribed you  2 new medicines.  Pantoprazole is an acid reducing medication to take every day which will help your stomach heal.  The other is Naltrexone, a medicine to take every day which may help reduce cravings for alcohol.  Please take Thiamine and Folate vitamins, since these levels often get low in people who drink alcohol.  Signed: Minus Liberty, MD 05/15/2017, 10:03 AM   Pager: [phone]

## 2017-05-15 NOTE — Progress Notes (Signed)
Subjective: Feels well, has arranged for inpatient substance use treatment at [facility] in [place], will be flying out this weekend.  Is anxious about telling her mother she is going back into rehab, but otherwise feels well and is ready to leave the hospital.  Objective:  Vital signs in last 24 hours: Vitals:   05/14/17 1245 05/14/17 2154 05/15/17 0222 05/15/17 0412  BP: 132/89 131/89 (!) 144/95 (!) 145/98  Pulse: (!) 108 94 97 (!) 101  Resp: 16 18 18    Temp: 98 F (36.7 C) 98.3 F (36.8 C) 99 F (37.2 C)   TempSrc: Oral Oral    SpO2: 99% 97% 99% 96%  Weight:      Height:       Physical Exam  Constitutional: She is oriented to person, place, and time. She appears well-developed and well-nourished. No distress.  Sitting in street clothes on the edge of her bed  Cardiovascular: Regular rhythm and normal heart sounds.   Tachycardic  Pulmonary/Chest: Effort normal and breath sounds normal.  Neurological: She is alert and oriented to person, place, and time.  No tremor  Skin: Skin is warm and dry.  Psychiatric: She has a normal mood and affect. Her behavior is normal.   CBC Latest Ref Rng & Units 05/15/2017 05/14/2017 05/13/2017  WBC 4.0 - 10.5 K/uL 4.2 5.7 4.1  Hemoglobin 12.0 - 15.0 g/dL 11.9(L) 10.9(L) 10.6(L)  Hematocrit 36.0 - 46.0 % 35.3(L) 32.7(L) 31.5(L)  Platelets 150 - 400 K/uL 132(L) 130(L) 133(L)   CMP Latest Ref Rng & Units 05/15/2017 05/14/2017 05/13/2017  Glucose 65 - 99 mg/dL 97 122(H) 127(H)  BUN 6 - 20 mg/dL <5(L) <5(L) <5(L)  Creatinine 0.44 - 1.00 mg/dL 0.55 0.65 0.76  Sodium 135 - 145 mmol/L 135 135 136  Potassium 3.5 - 5.1 mmol/L 3.9 3.0(L) 3.1(L)  Chloride 101 - 111 mmol/L 103 101 100(L)  CO2 22 - 32 mmol/L 23 24 28   Calcium 8.9 - 10.3 mg/dL 7.5(L) 7.3(L) 7.8(L)  Total Protein 6.5 - 8.1 g/dL - 5.7(L) 5.6(L)  Total Bilirubin 0.3 - 1.2 mg/dL - 3.0(H) 2.9(H)  Alkaline Phos 38 - 126 U/L - 110 118  AST 15 - 41 U/L - 140(H) 203(H)  ALT 14  - 54 U/L - 62(H) 78(H)   Component     Latest Ref Rng & Units 05/12/2017 05/13/2017 05/14/2017 05/15/2017  Lactic Acid, Venous     0.5 - 1.9 mmol/L 6.49 (HH) 2.4 (HH) 2.6 (HH) 1.7    Assessment/Plan:  Principal Problem:   Alcohol withdrawal (HCC) Active Problems:   Hypokalemia   Lactic acidosis   Hypomagnesemia   Dehydration  51 y.o. female with alcohol dependence who presents with alcohol withdrawal and abdominal pain.  Her withdrawal has been well controlled with benzodiazepines and her risk of complications is likely decreasing at this point. Her abdominal pain most likely represents alcoholic gastritis and/or chronic alcoholic hepatitis.  #Alcohol Dependence #Alcohol Withdrawal Withdrawal resolved.  History of possible withdrawal seizures.  No hallucinosis, DTs, or seizures this admission.  Last drink the morning of 5/21. -Chlordiazepoxide 25 mg once today then discontinue -Zofran PRN -Thiamine, folate, multivitamin  #Abdominal Pain #Alcoholic Hepatitis Resolved.  Likely alcoholic hepatitis, gastritis, and withdrawal. Lipase is normal, doubt acute pancreatitis.  LFTs downtrending with alcohol abstinence. -BID PPI -FOBT  #Hypomagnesemia #Hypokalemia Resolved -follow electrolytes  #Lactic Acidosis Resolved.  Lactate elevate to 6 at admission, attributable to hypovolemia form vomiting.  Now slowly normalized after IVF, is  eating and drinking, no vomiting.  Doubt infection, no meds identified to causes lactic acidosis, LFTs downtrending but impaired hepatic function could slow lactate metabolism.  Fluids: none Diet: regular DVT Prophylaxis: lovenox Code Status: full  Dispo: Anticipated discharge today.  Minus Liberty, MD 05/15/2017, 10:01 AM Pager: [phone]

## 2017-05-15 NOTE — Progress Notes (Signed)
CSW placed [facility] information on patient's discharge appointments.   Cynthia Schroeder Cynthia Schroeder LCSWA [phone]

## 2017-05-15 NOTE — Evaluation (Signed)
Physical Therapy Evaluation Patient Details Name: Cynthia Schroeder MRN: [id number] DOB: [birth year] Today's Date: 05/15/2017   History of Present Illness   SANTIAGO GRAF is a 51 y.o. female with history of HTN and alcohol dependence and prior alcohol withdrawal complicated by seizures who presents with nausea, vomiting, and abdominal pain.  Clinical Impression  Pt reports she is feeling much better today and is ready to d/c herself home. Pt was Independent with gait on the unit. Pt did demonstrate mild tremors, but it did not affect her gait on level surfaces on the unit. No obvious balance impairments at this present time in her sober state. Pt demonstrated good LE strength and sensation. Pt is safe to d/c home with family support when medically stable. Pt does not need follow-up therapy.     Follow Up Recommendations No PT follow up    Equipment Recommendations  None recommended by PT    Recommendations for Other Services       Precautions / Restrictions Precautions Precautions: None Restrictions Weight Bearing Restrictions: No      Mobility  Bed Mobility Overal bed mobility: Independent                Transfers Overall transfer level: Independent                  Ambulation/Gait Ambulation/Gait assistance: Independent Ambulation Distance (Feet): 200 Feet Assistive device: None Gait Pattern/deviations: Step-through pattern;Decreased stride length;Wide base of support   Gait velocity interpretation: at or above normal speed for age/gender General Gait Details: Decreased kness flexion during swing and fluidity of movmeent in LE. Pt reported a previous injury to her R kneee resulting in deviations.  Stairs            Wheelchair Mobility    Modified Rankin (Stroke Patients Only)       Balance Overall balance assessment: No apparent balance deficits (not formally assessed)                                            Pertinent Vitals/Pain Pain Assessment: No/denies pain    Home Living Family/patient expects to be discharged to:: Private residence Living Arrangements: Parent Available Help at Discharge: Family Type of Home: House Home Access: Stairs to enter Entrance Stairs-Rails: None Technical brewer of Steps: 2 Home Layout: One level        Prior Function Level of Independence: Independent               Hand Dominance        Extremity/Trunk Assessment   Upper Extremity Assessment Upper Extremity Assessment: Defer to OT evaluation    Lower Extremity Assessment Lower Extremity Assessment: Overall WFL for tasks assessed       Communication   Communication: No difficulties  Cognition Arousal/Alertness: Awake/alert Behavior During Therapy: WFL for tasks assessed/performed Overall Cognitive Status: Within Functional Limits for tasks assessed                                        General Comments General comments (skin integrity, edema, etc.): Pt very quick to tell me she was d/c herself and going to rehab for ETOH abuse. Pt did demonstrate mild tremors, but had no evidence of it affecting her gait and safety while sober.  Exercises     Assessment/Plan    PT Assessment Patent does not need any further PT services  PT Problem List         PT Treatment Interventions      PT Goals (Current goals can be found in the Care Plan section)       Frequency     Barriers to discharge        Co-evaluation               AM-PAC PT "6 Clicks" Daily Activity  Outcome Measure Difficulty turning over in bed (including adjusting bedclothes, sheets and blankets)?: None Difficulty moving from lying on back to sitting on the side of the bed? : None Difficulty sitting down on and standing up from a chair with arms (e.g., wheelchair, bedside commode, etc,.)?: None Help needed moving to and from a bed to chair (including a wheelchair)?:  None Help needed walking in hospital room?: None Help needed climbing 3-5 steps with a railing? : None 6 Click Score: 24    End of Session   Activity Tolerance: Patient tolerated treatment well Patient left: in bed;with call bell/phone within reach Nurse Communication: Mobility status (pt independent) PT Visit Diagnosis: Difficulty in walking, not elsewhere classified (R26.2)    Time: 7824-2353 PT Time Calculation (min) (ACUTE ONLY): 15 min   Charges:   PT Evaluation $PT Eval Low Complexity: 1 Procedure     PT G Codes:       Theodoro Grist PT  Lelon Mast 05/15/2017, 8:14 AM

## 2017-05-15 NOTE — Discharge Instructions (Addendum)
You were admitted to the hospital with alcohol withdrawal and also abdominal pain, nausea, and vomiting which I think was from alcohol damage to your stomach and/or liver (alcoholic gastritis and hepatitis).  We treated your withdrawal symptoms  Please make sure to get treatment for your alcohol use disorder.  I think the residential treatment at [facility] sounds like a great start.  You also need to find a primary care doctor to see regularly when you get back from [place].  I have prescribed you 2 new medicines.  Pantoprazole is an acid reducing medication to take every day which will help your stomach heal.  The other is Naltrexone, a medicine to take every day which may help reduce cravings for alcohol.  Please take Thiamine and Folate vitamins, since these levels often get low in people who drink alcohol.    Alcohol Withdrawal Alcohol withdrawal is a group of symptoms that can develop when a person who drinks heavily and regularly stops drinking or drinks less. What are the causes? Heavy and regular drinking can cause chemicals that send signals from the brain to the body (neurotransmitters) to deactivate. Alcohol withdrawal develops when deactivated neurotransmitters reactivate because a person stops drinking or drinks less. What increases the risk? The more a person drinks and the longer he or she drinks, the greater the risk of alcohol withdrawal. Severe withdrawal is more likely to develop in someone who:  Had severe alcohol withdrawal in the past.  Had a seizure during a previous episode of alcohol withdrawal.  Is elderly.  Is pregnant.  Has been abusing drugs.  Has other medical problems, including:  Infection.  Heart, lung, or liver disease.  Seizures.  Mental health problems. What are the signs or symptoms? Symptoms of this condition can be mild to moderate, or they can be severe. Mild to moderate symptoms may include:  Fatigue.  Nightmares.  Trouble  sleeping.  Depression.  Anxiety.  Inability to think clearly.  Mood swings.  Irritability.  Loss of appetite.  Nausea or vomiting.  Clammy skin.  Extreme sweating.  Rapid heartbeat.  Shakiness.  Uncontrollable shaking (tremor). Severe symptoms may include:  Fever.  Seizures.  Severeconfusion.  Feeling or seeing things that are not there (hallucinations). Symptoms usually begin within eight hours after a person stops drinking or drinks less. They can last for weeks. How is this diagnosed? Alcohol withdrawal is diagnosed with a medical history and physical exam. Sometimes, urine and blood tests are also done. How is this treated? Treatment may involve:  Monitoring blood pressure, pulse, and breathing.  Getting fluids through an IV tube.  Medicine to reduce anxiety.  Medicine to prevent or control seizures.  Multivitamins and B vitamins.  Having a health care provider check on you daily. If symptoms are moderate to severe or if there is a risk of severe withdrawal, treatment may be done at a hospital or treatment center. Follow these instructions at home:  Take medicines and vitamin supplements only as directed by your health care provider.  Do not drink alcohol.  Have someone stay with you or be available if you need help.  Drink enough fluid to keep your urine clear or pale yellow.  Consider joining a 12-step program or another alcohol support group. Contact a health care provider if:  Your symptoms get worse or do not go away.  You cannot keep food or water in your stomach.  You are struggling with not drinking alcohol.  You cannot stop drinking alcohol. Get  help right away if:  You have an irregular heartbeat.  You have chest pain.  You have trouble breathing.  You have symptoms of severe withdrawal, such as:  A fever.  Seizures.  Severe confusion.  Hallucinations. This information is not intended to replace advice given to  you by your health care provider. Make sure you discuss any questions you have with your health care provider. Document Released: 09/18/2005 Document Revised: 04/17/2016 Document Reviewed: 09/27/2014 Elsevier Interactive Patient Education  2017 [employer].

## 2017-06-04 ENCOUNTER — Inpatient Hospital Stay (INDEPENDENT_AMBULATORY_CARE_PROVIDER_SITE_OTHER): Payer: BLUE CROSS/BLUE SHIELD | Admitting: Physician Assistant

## 2017-07-18 ENCOUNTER — Encounter (HOSPITAL_COMMUNITY): Payer: Self-pay | Admitting: Emergency Medicine

## 2017-07-18 ENCOUNTER — Emergency Department (HOSPITAL_COMMUNITY)
Admission: EM | Admit: 2017-07-18 | Discharge: 2017-07-18 | Disposition: A | Discharge status: Home / Self Care | Payer: BLUE CROSS/BLUE SHIELD | Attending: Emergency Medicine | Admitting: Emergency Medicine

## 2017-07-18 DIAGNOSIS — F10239 Alcohol dependence with withdrawal, unspecified: Secondary | ICD-10-CM | POA: Diagnosis not present

## 2017-07-18 DIAGNOSIS — R112 Nausea with vomiting, unspecified: Secondary | ICD-10-CM

## 2017-07-18 DIAGNOSIS — Z79899 Other long term (current) drug therapy: Secondary | ICD-10-CM | POA: Diagnosis not present

## 2017-07-18 DIAGNOSIS — I10 Essential (primary) hypertension: Secondary | ICD-10-CM | POA: Insufficient documentation

## 2017-07-18 DIAGNOSIS — Z885 Allergy status to narcotic agent status: Secondary | ICD-10-CM | POA: Insufficient documentation

## 2017-07-18 DIAGNOSIS — F17210 Nicotine dependence, cigarettes, uncomplicated: Secondary | ICD-10-CM | POA: Diagnosis not present

## 2017-07-18 DIAGNOSIS — F10920 Alcohol use, unspecified with intoxication, uncomplicated: Secondary | ICD-10-CM | POA: Diagnosis present

## 2017-07-18 DIAGNOSIS — F1010 Alcohol abuse, uncomplicated: Secondary | ICD-10-CM | POA: Diagnosis not present

## 2017-07-18 LAB — COMPREHENSIVE METABOLIC PANEL
ALK PHOS: 124 U/L (ref 38–126)
ALT: 54 U/L (ref 14–54)
ANION GAP: 25 — AB (ref 5–15)
AST: 147 U/L — ABNORMAL HIGH (ref 15–41)
Albumin: 4.3 g/dL (ref 3.5–5.0)
BUN: 8 mg/dL (ref 6–20)
CALCIUM: 9 mg/dL (ref 8.9–10.3)
CHLORIDE: 90 mmol/L — AB (ref 101–111)
CO2: 21 mmol/L — AB (ref 22–32)
Creatinine, Ser: 1.21 mg/dL — ABNORMAL HIGH (ref 0.44–1.00)
GFR calc non Af Amer: 52 mL/min — ABNORMAL LOW (ref >60)
Glucose, Bld: 279 mg/dL — ABNORMAL HIGH (ref 65–99)
Potassium: 3.7 mmol/L (ref 3.5–5.1)
SODIUM: 136 mmol/L (ref 135–145)
Total Bilirubin: 3.2 mg/dL — ABNORMAL HIGH (ref 0.3–1.2)
Total Protein: 8.1 g/dL (ref 6.5–8.1)

## 2017-07-18 LAB — CBC WITH DIFFERENTIAL/PLATELET
Basophils Absolute: 0 10*3/uL (ref 0.0–0.1)
Basophils Relative: 0 %
EOS ABS: 0 10*3/uL (ref 0.0–0.7)
EOS PCT: 0 %
HCT: 43.6 % (ref 36.0–46.0)
Hemoglobin: 14.6 g/dL (ref 12.0–15.0)
LYMPHS ABS: 0.4 10*3/uL — AB (ref 0.7–4.0)
Lymphocytes Relative: 5 %
MCH: 35.3 pg — AB (ref 26.0–34.0)
MCHC: 33.5 g/dL (ref 30.0–36.0)
MCV: 105.3 fL — ABNORMAL HIGH (ref 78.0–100.0)
MONOS PCT: 5 %
Monocytes Absolute: 0.4 10*3/uL (ref 0.1–1.0)
Neutro Abs: 6.8 10*3/uL (ref 1.7–7.7)
Neutrophils Relative %: 90 %
PLATELETS: 151 10*3/uL (ref 150–400)
RBC: 4.14 MIL/uL (ref 3.87–5.11)
RDW: 16.5 % — ABNORMAL HIGH (ref 11.5–15.5)
WBC: 7.6 10*3/uL (ref 4.0–10.5)

## 2017-07-18 LAB — URINALYSIS, ROUTINE W REFLEX MICROSCOPIC
Bilirubin Urine: NEGATIVE
GLUCOSE, UA: NEGATIVE mg/dL
KETONES UR: 80 mg/dL — AB
Leukocytes, UA: NEGATIVE
NITRITE: NEGATIVE
PH: 5 (ref 5.0–8.0)
Protein, ur: 100 mg/dL — AB
SPECIFIC GRAVITY, URINE: 1.021 (ref 1.005–1.030)

## 2017-07-18 LAB — MAGNESIUM: Magnesium: 1.4 mg/dL — ABNORMAL LOW (ref 1.7–2.4)

## 2017-07-18 LAB — ETHANOL

## 2017-07-18 MED ORDER — THIAMINE HCL 100 MG/ML IJ SOLN: 100.0000 mg | Freq: Every day | INTRAMUSCULAR | Status: DC

## 2017-07-18 MED ORDER — LORAZEPAM 2 MG/ML IJ SOLN
1.0000 mg | Freq: Once | INTRAMUSCULAR | Status: AC
  Administered 2017-07-18: 1 mg via INTRAVENOUS
  Filled 2017-07-18: qty 1

## 2017-07-18 MED ORDER — SODIUM CHLORIDE 0.9 % IV BOLUS (SEPSIS)
1000.0000 mL | Freq: Once | INTRAVENOUS | Status: AC
  Administered 2017-07-18: 1000 mL via INTRAVENOUS

## 2017-07-18 MED ORDER — LORAZEPAM 1 MG PO TABS: 0.0000 mg | ORAL_TABLET | Freq: Two times a day (BID) | ORAL | Status: DC

## 2017-07-18 MED ORDER — CHLORDIAZEPOXIDE HCL 25 MG PO CAPS: ORAL_CAPSULE | ORAL | 0 refills | Status: DC

## 2017-07-18 MED ORDER — LORAZEPAM 2 MG/ML IJ SOLN: 0.0000 mg | Freq: Two times a day (BID) | INTRAMUSCULAR | Status: DC

## 2017-07-18 MED ORDER — FAMOTIDINE 20 MG PO TABS: 20.0000 mg | ORAL_TABLET | Freq: Two times a day (BID) | ORAL | 0 refills | Status: AC

## 2017-07-18 MED ORDER — ONDANSETRON HCL 4 MG/2ML IJ SOLN
4.0000 mg | Freq: Once | INTRAMUSCULAR | Status: AC
  Administered 2017-07-18: 4 mg via INTRAVENOUS
  Filled 2017-07-18: qty 2

## 2017-07-18 MED ORDER — LORAZEPAM 2 MG/ML IJ SOLN
0.0000 mg | Freq: Four times a day (QID) | INTRAMUSCULAR | Status: DC
Start: 2017-07-18 — End: 2017-07-18

## 2017-07-18 MED ORDER — ONDANSETRON 4 MG PO TBDP: 4.0000 mg | ORAL_TABLET | Freq: Three times a day (TID) | ORAL | 0 refills | Status: AC | PRN

## 2017-07-18 MED ORDER — LORAZEPAM 1 MG PO TABS: 0.0000 mg | ORAL_TABLET | Freq: Four times a day (QID) | ORAL | Status: DC

## 2017-07-18 MED ORDER — FAMOTIDINE IN NACL 20-0.9 MG/50ML-% IV SOLN
20.0000 mg | Freq: Once | INTRAVENOUS | Status: AC
  Administered 2017-07-18: 20 mg via INTRAVENOUS
  Filled 2017-07-18: qty 50

## 2017-07-18 MED ORDER — VITAMIN B-1 100 MG PO TABS
100.0000 mg | ORAL_TABLET | Freq: Every day | ORAL | Status: DC
  Administered 2017-07-18: 100 mg via ORAL
  Filled 2017-07-18: qty 1

## 2017-07-18 MED ORDER — MAGNESIUM SULFATE 2 GM/50ML IV SOLN
2.0000 g | Freq: Once | INTRAVENOUS | Status: AC
  Administered 2017-07-18: 2 g via INTRAVENOUS
  Filled 2017-07-18: qty 50

## 2017-07-18 NOTE — ED Provider Notes (Signed)
[facility] DEPT Provider Note   CSN: [id number] Arrival date & time: 07/18/17  1124     History   Chief Complaint Chief Complaint  Patient presents with  . Alcohol Intoxication  . Delirium Tremens (DTS)    HPI Cynthia Schroeder is a 51 y.o. female.  Pt presents to the ED today with alcohol withdrawal.  The pt has a hx of alcohol abuse with prior admissions for alcohol withdrawal.  The pt woke up this morning feeling very shaky and uncoordinated.  Last night, she had her last drink around 2000.  She had a glass of wine this morning at 0500 which did not help the shakes.  She has been vomiting since then.      Past Medical History:  Diagnosis Date  . Anxiety   . ETOH abuse   . Hypertension   . Seizures ([place])   . TIA (transient ischemic attack)     Patient Active Problem List   Diagnosis Date Noted  . Alcoholic gastritis 40/98/1191  . Lactic acidosis 05/14/2017  . Hypomagnesemia 05/14/2017  . Dehydration   . Alcohol withdrawal ([place]) 05/12/2017  . Seizure ([place]) 02/22/2017  . Anxiety 02/22/2017  . Hypertension 02/22/2017  . Alcohol dependence ([place]) 02/22/2017  . Hypokalemia 02/22/2017  . Alcoholic hepatitis 47/82/9562  . Leukopenia 02/22/2017  . Thrombocytopenia ([place]) 02/22/2017  . Current smoker 02/22/2017  . Alcohol withdrawal syndrome with complication ([place]) 13/07/6577    Past Surgical History:  Procedure Laterality Date  . ANTERIOR CRUCIATE LIGAMENT REPAIR Right     OB History    No data available       Home Medications    Prior to Admission medications   Medication Sig Start Date End Date Taking? Authorizing Provider  chlordiazePOXIDE (LIBRIUM) 25 MG capsule 50mg  PO TID x 1D, then 25-50mg  PO BID X 1D, then 25-50mg  PO QD X 1D 07/18/17   Isla Pence, MD  famotidine (PEPCID) 20 MG tablet Take 1 tablet (20 mg total) by mouth 2 (two) times daily. 07/18/17   Isla Pence, MD  FLUoxetine (PROZAC) 20 MG capsule Take 1 capsule (20 mg total) by  mouth daily. Patient not taking: Reported on 05/12/2017 02/28/17   Damita Lack, MD  Multiple Vitamins-Minerals (MULTI-VITAMIN GUMMIES) CHEW Chew 2 each by mouth daily.    [provider]  nicotine (NICODERM CQ - DOSED IN MG/24 HOURS) 14 mg/24hr patch Place 1 patch (14 mg total) onto the skin daily. Patient not taking: Reported on 05/12/2017 03/01/17   Damita Lack, MD  ondansetron (ZOFRAN ODT) 4 MG disintegrating tablet Take 1 tablet (4 mg total) by mouth every 8 (eight) hours as needed for nausea or vomiting. 07/18/17   Isla Pence, MD  pantoprazole (PROTONIX) 40 MG tablet Take 1 tablet (40 mg total) by mouth 2 (two) times daily. 05/15/17 06/14/17  Minus Liberty, MD    Family History Family History  Problem Relation Age of Onset  . Hypertension Mother     Social History Social History  Substance Use Topics  . Smoking status: Current Every Day Smoker    Packs/day: 0.50    Years: 32.00    Types: Cigarettes  . Smokeless tobacco: Never Used  . Alcohol use 12.0 oz/week    20 Shots of liquor per week     Allergies   Codeine   Review of Systems Review of Systems  Gastrointestinal: Positive for nausea and vomiting.  Neurological: Positive for tremors.  Psychiatric/Behavioral: The patient is nervous/anxious.  All other systems reviewed and are negative.    Physical Exam Updated Vital Signs BP 133/89   Pulse (!) 122   Temp 98.2 F (36.8 C) (Oral)   Resp 15   SpO2 (!) 85%   Physical Exam  Constitutional: She is oriented to person, place, and time. She appears distressed.  HENT:  Head: Normocephalic and atraumatic.  Right Ear: External ear normal.  Left Ear: External ear normal.  Nose: Nose normal.  Mouth/Throat: Mucous membranes are dry.  Eyes: Pupils are equal, round, and reactive to light. Conjunctivae and EOM are normal.  Neck: Normal range of motion. Neck supple.  Cardiovascular: Regular rhythm, normal heart sounds and intact distal  pulses.  Tachycardia present.   Pulmonary/Chest: Effort normal and breath sounds normal.  Abdominal: Soft. Bowel sounds are normal. There is tenderness in the epigastric area.  Musculoskeletal: Normal range of motion.  Neurological: She is alert and oriented to person, place, and time. She displays tremor.  Skin: Skin is warm.  Psychiatric: Her mood appears anxious.  Nursing note and vitals reviewed.    ED Treatments / Results  Labs (all labs ordered are listed, but only abnormal results are displayed) Labs Reviewed  CBC WITH DIFFERENTIAL/PLATELET - Abnormal; Notable for the following:       Result Value   MCV 105.3 (*)    MCH 35.3 (*)    RDW 16.5 (*)    Lymphs Abs 0.4 (*)    All other components within normal limits  COMPREHENSIVE METABOLIC PANEL - Abnormal; Notable for the following:    Chloride 90 (*)    CO2 21 (*)    Glucose, Bld 279 (*)    Creatinine, Ser 1.21 (*)    AST 147 (*)    Total Bilirubin 3.2 (*)    GFR calc non Af Amer 52 (*)    Anion gap 25 (*)    All other components within normal limits  URINALYSIS, ROUTINE W REFLEX MICROSCOPIC - Abnormal; Notable for the following:    APPearance HAZY (*)    Hgb urine dipstick SMALL (*)    Ketones, ur 80 (*)    Protein, ur 100 (*)    Bacteria, UA FEW (*)    Squamous Epithelial / LPF 0-5 (*)    All other components within normal limits  MAGNESIUM - Abnormal; Notable for the following:    Magnesium 1.4 (*)    All other components within normal limits  ETHANOL    EKG  EKG Interpretation None       Radiology No results found.  Procedures Procedures (including critical care time)  Medications Ordered in ED Medications  LORazepam (ATIVAN) injection 0-4 mg (0 mg Intravenous Not Given 07/18/17 1256)    Or  LORazepam (ATIVAN) tablet 0-4 mg ( Oral See Alternative 07/18/17 1256)  LORazepam (ATIVAN) injection 0-4 mg (not administered)    Or  LORazepam (ATIVAN) tablet 0-4 mg (not administered)  thiamine (VITAMIN  B-1) tablet 100 mg (100 mg Oral Given 07/18/17 1411)    Or  thiamine (B-1) injection 100 mg ( Intravenous See Alternative 07/18/17 1411)  magnesium sulfate IVPB 2 g 50 mL (2 g Intravenous New Bag/Given 07/18/17 1540)  sodium chloride 0.9 % bolus 1,000 mL (0 mLs Intravenous Stopped 07/18/17 1359)  ondansetron (ZOFRAN) injection 4 mg (4 mg Intravenous Given 07/18/17 1227)  famotidine (PEPCID) IVPB 20 mg premix (0 mg Intravenous Stopped 07/18/17 1257)  LORazepam (ATIVAN) injection 1 mg (1 mg Intravenous Given 07/18/17 1228)  sodium  chloride 0.9 % bolus 1,000 mL (1,000 mLs Intravenous New Bag/Given 07/18/17 1413)  LORazepam (ATIVAN) injection 1 mg (1 mg Intravenous Given 07/18/17 1411)  LORazepam (ATIVAN) injection 1 mg (1 mg Intravenous Given 07/18/17 1501)     Initial Impression / Assessment and Plan / ED Course  I have reviewed the triage vital signs and the nursing notes.  Pertinent labs & imaging results that were available during my care of the patient were reviewed by me and considered in my medical decision making (see chart for details).    Pt has improved significantly, but is still shaky.  Her HR has lowered, but still remains tachycardic.  The pt is offered admission, but she declines.  She wants to go home.  She is able to ambulate and tolerate po fluids.  She will be d/c per her request.  She knows to return if worse.  Final Clinical Impressions(s) / ED Diagnoses   Final diagnoses:  Hypomagnesemia  Alcohol abuse  Non-intractable vomiting with nausea, unspecified vomiting type    New Prescriptions New Prescriptions   CHLORDIAZEPOXIDE (LIBRIUM) 25 MG CAPSULE    50mg  PO TID x 1D, then 25-50mg  PO BID X 1D, then 25-50mg  PO QD X 1D   FAMOTIDINE (PEPCID) 20 MG TABLET    Take 1 tablet (20 mg total) by mouth 2 (two) times daily.   ONDANSETRON (ZOFRAN ODT) 4 MG DISINTEGRATING TABLET    Take 1 tablet (4 mg total) by mouth every 8 (eight) hours as needed for nausea or vomiting.     Isla Pence, MD 07/18/17 [phone]

## 2017-07-18 NOTE — ED Notes (Signed)
Patient ambulated in hall x 1 assist. Gait unsteady.

## 2017-07-18 NOTE — ED Triage Notes (Addendum)
States wamts detox , arrives shaky and unco ordinated rto triage , gave wrong name to , states has sz with withdrawal , last drin k was wine this am but realized it was not stopping shakes, states drank all yesterday , denies SI and HI

## 2017-07-18 NOTE — Discharge Instructions (Signed)
Do not drink alcohol.

## 2017-07-19 NOTE — ED Notes (Signed)
Ativan  1mg   Wasted in the sharps  with Janyce Llanos as witness.Pt already DC.

## 2017-07-24 ENCOUNTER — Other Ambulatory Visit: Payer: Self-pay | Admitting: Obstetrics and Gynecology

## 2017-07-24 DIAGNOSIS — R928 Other abnormal and inconclusive findings on diagnostic imaging of breast: Secondary | ICD-10-CM

## 2017-08-04 ENCOUNTER — Other Ambulatory Visit: Payer: BLUE CROSS/BLUE SHIELD

## 2017-08-04 ENCOUNTER — Inpatient Hospital Stay: Admission: RE | Admit: 2017-08-04 | Payer: BLUE CROSS/BLUE SHIELD | Source: Ambulatory Visit

## 2017-08-20 ENCOUNTER — Other Ambulatory Visit: Payer: Self-pay | Admitting: Internal Medicine

## 2017-08-20 DIAGNOSIS — R000 Tachycardia, unspecified: Secondary | ICD-10-CM

## 2017-08-26 ENCOUNTER — Other Ambulatory Visit (HOSPITAL_COMMUNITY): Payer: BLUE CROSS/BLUE SHIELD

## 2017-09-09 ENCOUNTER — Ambulatory Visit (HOSPITAL_COMMUNITY): Payer: BLUE CROSS/BLUE SHIELD | Attending: Internal Medicine

## 2017-09-09 ENCOUNTER — Encounter (INDEPENDENT_AMBULATORY_CARE_PROVIDER_SITE_OTHER): Payer: Self-pay

## 2017-09-09 ENCOUNTER — Other Ambulatory Visit: Payer: Self-pay

## 2017-09-09 DIAGNOSIS — I517 Cardiomegaly: Secondary | ICD-10-CM | POA: Insufficient documentation

## 2017-09-09 DIAGNOSIS — I5189 Other ill-defined heart diseases: Secondary | ICD-10-CM | POA: Insufficient documentation

## 2017-09-09 DIAGNOSIS — R000 Tachycardia, unspecified: Secondary | ICD-10-CM | POA: Diagnosis not present

## 2017-09-16 ENCOUNTER — Ambulatory Visit
Admission: RE | Admit: 2017-09-16 | Discharge: 2017-09-16 | Disposition: A | Discharge status: Home / Self Care | Payer: BLUE CROSS/BLUE SHIELD | Source: Ambulatory Visit | Attending: Obstetrics and Gynecology | Admitting: Obstetrics and Gynecology

## 2017-09-16 ENCOUNTER — Other Ambulatory Visit: Payer: Self-pay | Admitting: Obstetrics and Gynecology

## 2017-09-16 DIAGNOSIS — R928 Other abnormal and inconclusive findings on diagnostic imaging of breast: Secondary | ICD-10-CM

## 2017-09-16 DIAGNOSIS — N6310 Unspecified lump in the right breast, unspecified quadrant: Secondary | ICD-10-CM

## 2017-09-18 ENCOUNTER — Other Ambulatory Visit: Payer: Self-pay | Admitting: Obstetrics and Gynecology

## 2017-09-18 ENCOUNTER — Ambulatory Visit
Admission: RE | Admit: 2017-09-18 | Discharge: 2017-09-18 | Disposition: A | Discharge status: Home / Self Care | Payer: BLUE CROSS/BLUE SHIELD | Source: Ambulatory Visit | Attending: Obstetrics and Gynecology | Admitting: Obstetrics and Gynecology

## 2017-09-18 DIAGNOSIS — N6310 Unspecified lump in the right breast, unspecified quadrant: Secondary | ICD-10-CM

## 2017-09-23 ENCOUNTER — Other Ambulatory Visit: Payer: Self-pay | Admitting: Obstetrics and Gynecology

## 2017-09-23 DIAGNOSIS — D249 Benign neoplasm of unspecified breast: Secondary | ICD-10-CM

## 2017-09-23 DIAGNOSIS — R928 Other abnormal and inconclusive findings on diagnostic imaging of breast: Secondary | ICD-10-CM

## 2017-10-10 ENCOUNTER — Other Ambulatory Visit: Payer: Self-pay

## 2017-10-24 ENCOUNTER — Encounter: Payer: Self-pay | Admitting: Cardiovascular Disease

## 2017-11-06 ENCOUNTER — Ambulatory Visit: Payer: BLUE CROSS/BLUE SHIELD | Admitting: Cardiovascular Disease

## 2017-11-09 ENCOUNTER — Encounter (HOSPITAL_COMMUNITY): Payer: Self-pay | Admitting: Emergency Medicine

## 2017-11-09 ENCOUNTER — Other Ambulatory Visit: Payer: Self-pay

## 2017-11-09 ENCOUNTER — Inpatient Hospital Stay (HOSPITAL_COMMUNITY)
Admission: EM | Admit: 2017-11-09 | Discharge: 2017-11-12 | DRG: 897 | Disposition: A | Discharge status: Home / Self Care | Payer: BLUE CROSS/BLUE SHIELD | Attending: Family Medicine | Admitting: Family Medicine

## 2017-11-09 ENCOUNTER — Emergency Department (HOSPITAL_COMMUNITY): Payer: BLUE CROSS/BLUE SHIELD

## 2017-11-09 DIAGNOSIS — K2920 Alcoholic gastritis without bleeding: Secondary | ICD-10-CM | POA: Diagnosis present

## 2017-11-09 DIAGNOSIS — R112 Nausea with vomiting, unspecified: Secondary | ICD-10-CM

## 2017-11-09 DIAGNOSIS — F10939 Alcohol use, unspecified with withdrawal, unspecified: Secondary | ICD-10-CM | POA: Diagnosis present

## 2017-11-09 DIAGNOSIS — K219 Gastro-esophageal reflux disease without esophagitis: Secondary | ICD-10-CM | POA: Diagnosis present

## 2017-11-09 DIAGNOSIS — F17210 Nicotine dependence, cigarettes, uncomplicated: Secondary | ICD-10-CM | POA: Diagnosis present

## 2017-11-09 DIAGNOSIS — Y908 Blood alcohol level of 240 mg/100 ml or more: Secondary | ICD-10-CM | POA: Diagnosis present

## 2017-11-09 DIAGNOSIS — R945 Abnormal results of liver function studies: Secondary | ICD-10-CM | POA: Diagnosis present

## 2017-11-09 DIAGNOSIS — I10 Essential (primary) hypertension: Secondary | ICD-10-CM | POA: Diagnosis present

## 2017-11-09 DIAGNOSIS — E876 Hypokalemia: Secondary | ICD-10-CM | POA: Diagnosis present

## 2017-11-09 DIAGNOSIS — R7989 Other specified abnormal findings of blood chemistry: Secondary | ICD-10-CM | POA: Diagnosis present

## 2017-11-09 DIAGNOSIS — Z8673 Personal history of transient ischemic attack (TIA), and cerebral infarction without residual deficits: Secondary | ICD-10-CM

## 2017-11-09 DIAGNOSIS — F10239 Alcohol dependence with withdrawal, unspecified: Secondary | ICD-10-CM | POA: Diagnosis not present

## 2017-11-09 DIAGNOSIS — F319 Bipolar disorder, unspecified: Secondary | ICD-10-CM | POA: Diagnosis present

## 2017-11-09 DIAGNOSIS — R109 Unspecified abdominal pain: Secondary | ICD-10-CM | POA: Diagnosis not present

## 2017-11-09 DIAGNOSIS — F17200 Nicotine dependence, unspecified, uncomplicated: Secondary | ICD-10-CM | POA: Diagnosis present

## 2017-11-09 DIAGNOSIS — F10930 Alcohol use, unspecified with withdrawal, uncomplicated: Secondary | ICD-10-CM

## 2017-11-09 DIAGNOSIS — F4310 Post-traumatic stress disorder, unspecified: Secondary | ICD-10-CM | POA: Diagnosis present

## 2017-11-09 DIAGNOSIS — F10230 Alcohol dependence with withdrawal, uncomplicated: Secondary | ICD-10-CM

## 2017-11-09 DIAGNOSIS — Z885 Allergy status to narcotic agent status: Secondary | ICD-10-CM

## 2017-11-09 LAB — CBC
HEMATOCRIT: 42.8 % (ref 36.0–46.0)
HEMOGLOBIN: 14.9 g/dL (ref 12.0–15.0)
MCH: 37.9 pg — AB (ref 26.0–34.0)
MCHC: 34.8 g/dL (ref 30.0–36.0)
MCV: 108.9 fL — AB (ref 78.0–100.0)
PLATELETS: 448 10*3/uL — AB (ref 150–400)
RBC: 3.93 MIL/uL (ref 3.87–5.11)
RDW: 13.4 % (ref 11.5–15.5)
WBC: 8.3 10*3/uL (ref 4.0–10.5)

## 2017-11-09 LAB — COMPREHENSIVE METABOLIC PANEL
ALBUMIN: 3.2 g/dL — AB (ref 3.5–5.0)
ALK PHOS: 213 U/L — AB (ref 38–126)
ALT: 121 U/L — ABNORMAL HIGH (ref 14–54)
ANION GAP: 17 — AB (ref 5–15)
AST: 339 U/L — ABNORMAL HIGH (ref 15–41)
BUN: <5 mg/dL — ABNORMAL LOW (ref 6–20)
CALCIUM: 8.9 mg/dL (ref 8.9–10.3)
CO2: 26 mmol/L (ref 22–32)
Chloride: 98 mmol/L — ABNORMAL LOW (ref 101–111)
Creatinine, Ser: 0.57 mg/dL (ref 0.44–1.00)
GFR calc Af Amer: >60 mL/min (ref >60)
GFR calc non Af Amer: >60 mL/min (ref >60)
GLUCOSE: 147 mg/dL — AB (ref 65–99)
POTASSIUM: 3.3 mmol/L — AB (ref 3.5–5.1)
SODIUM: 141 mmol/L (ref 135–145)
Total Bilirubin: 1.1 mg/dL (ref 0.3–1.2)
Total Protein: 7.6 g/dL (ref 6.5–8.1)

## 2017-11-09 LAB — RAPID URINE DRUG SCREEN, HOSP PERFORMED
Amphetamines: NOT DETECTED
BENZODIAZEPINES: NOT DETECTED
Barbiturates: NOT DETECTED
Cocaine: NOT DETECTED
OPIATES: NOT DETECTED
Tetrahydrocannabinol: NOT DETECTED

## 2017-11-09 LAB — LIPASE, BLOOD: LIPASE: 28 U/L (ref 11–51)

## 2017-11-09 LAB — ETHANOL: Alcohol, Ethyl (B): 299 mg/dL — ABNORMAL HIGH (ref <10)

## 2017-11-09 MED ORDER — IOPAMIDOL (ISOVUE-300) INJECTION 61%
100.0000 mL | Freq: Once | INTRAVENOUS | Status: AC | PRN
  Administered 2017-11-09: 100 mL via INTRAVENOUS

## 2017-11-09 MED ORDER — VITAMIN B-1 100 MG PO TABS
100.0000 mg | ORAL_TABLET | Freq: Every day | ORAL | Status: DC
Start: 2017-11-09 — End: 2017-11-12
  Administered 2017-11-11 – 2017-11-12 (×2): 100 mg via ORAL
  Filled 2017-11-09 (×2): qty 1

## 2017-11-09 MED ORDER — LORAZEPAM 1 MG PO TABS: 0.0000 mg | ORAL_TABLET | Freq: Four times a day (QID) | ORAL | Status: DC

## 2017-11-09 MED ORDER — SODIUM CHLORIDE 0.9 % IV BOLUS (SEPSIS)
1000.0000 mL | Freq: Once | INTRAVENOUS | Status: AC
  Administered 2017-11-09: 1000 mL via INTRAVENOUS

## 2017-11-09 MED ORDER — LORAZEPAM 1 MG PO TABS: 0.0000 mg | ORAL_TABLET | Freq: Two times a day (BID) | ORAL | Status: DC

## 2017-11-09 MED ORDER — LORAZEPAM 2 MG/ML IJ SOLN: 0.0000 mg | Freq: Two times a day (BID) | INTRAMUSCULAR | Status: DC

## 2017-11-09 MED ORDER — MAGNESIUM SULFATE IN D5W 1-5 GM/100ML-% IV SOLN
1.0000 g | Freq: Once | INTRAVENOUS | Status: AC
  Administered 2017-11-10: 1 g via INTRAVENOUS
  Filled 2017-11-09: qty 100

## 2017-11-09 MED ORDER — THIAMINE HCL 100 MG/ML IJ SOLN
100.0000 mg | Freq: Every day | INTRAMUSCULAR | Status: DC
  Administered 2017-11-09 – 2017-11-10 (×2): 100 mg via INTRAVENOUS
  Filled 2017-11-09 (×3): qty 2

## 2017-11-09 MED ORDER — LORAZEPAM 2 MG/ML IJ SOLN
0.0000 mg | Freq: Four times a day (QID) | INTRAMUSCULAR | Status: DC
  Administered 2017-11-09: 2 mg via INTRAVENOUS
  Filled 2017-11-09: qty 1

## 2017-11-09 NOTE — ED Notes (Signed)
Pt having dry heaves in room. Will continue to monitor.

## 2017-11-09 NOTE — ED Provider Notes (Signed)
[facility] EMERGENCY DEPARTMENT Provider Note   CSN: [id number] Arrival date & time: 11/09/17  1826     History   Chief Complaint Chief Complaint  Patient presents with  . Alcohol Problem    HPI Cynthia Schroeder is a 51 y.o. female.  Who presents the emergency department with chief complaint of abdominal pain and intractable vomiting.  She is a past medical history of alcohol abuse and dependence.  Patient states that her only period of sobriety was for about 40 days when she went to rehab but other than that she has been drinking for many years.  She has had a history of alcohol withdrawal seizures and gets extremely tremulous when she goes for about 4 hours without alcohol.  She states that over the past 4 days she has increased the amount of alcohol consumption and drinking about 15 airplane-sized bottles of vodka daily however I suspect more as the patient is in obvious withdrawal at an EtOH level of 299.  The patient states that she has not been able to eat for the past 4 days.  She has been vomiting every time she tries to eat.  She complains of right upper quadrant abdominal pain.  She denies fevers, chills, urinary symptoms, diarrhea or constipation.  HPI  Past Medical History:  Diagnosis Date  . Abnormal liver enzymes   . Anxiety   . ETOH abuse   . Hypertension   . Seizures ([place])   . Tachycardia, unspecified   . TIA (transient ischemic attack)     Patient Active Problem List   Diagnosis Date Noted  . Alcoholic gastritis 50/93/2671  . Lactic acidosis 05/14/2017  . Hypomagnesemia 05/14/2017  . Dehydration   . Alcohol withdrawal ([place]) 05/12/2017  . Seizure ([place]) 02/22/2017  . Anxiety 02/22/2017  . Hypertension 02/22/2017  . Alcohol dependence ([place]) 02/22/2017  . Hypokalemia 02/22/2017  . Alcoholic hepatitis 24/58/0998  . Leukopenia 02/22/2017  . Thrombocytopenia ([place]) 02/22/2017  . Current smoker 02/22/2017  . Alcohol withdrawal syndrome with  complication ([place]) 33/82/5053    Past Surgical History:  Procedure Laterality Date  . ANTERIOR CRUCIATE LIGAMENT REPAIR Right     OB History    No data available       Home Medications    Prior to Admission medications   Medication Sig Start Date End Date Taking? Authorizing Provider  chlordiazePOXIDE (LIBRIUM) 25 MG capsule 50mg  PO TID x 1D, then 25-50mg  PO BID X 1D, then 25-50mg  PO QD X 1D 07/18/17   Isla Pence, MD  famotidine (PEPCID) 20 MG tablet Take 1 tablet (20 mg total) by mouth 2 (two) times daily. 07/18/17   Isla Pence, MD  FLUoxetine (PROZAC) 20 MG capsule Take 1 capsule (20 mg total) by mouth daily. Patient not taking: Reported on 05/12/2017 02/28/17   Damita Lack, MD  Multiple Vitamins-Minerals (MULTI-VITAMIN GUMMIES) CHEW Chew 2 each by mouth daily.    [provider]  nicotine (NICODERM CQ - DOSED IN MG/24 HOURS) 14 mg/24hr patch Place 1 patch (14 mg total) onto the skin daily. Patient not taking: Reported on 05/12/2017 03/01/17   Damita Lack, MD  ondansetron (ZOFRAN ODT) 4 MG disintegrating tablet Take 1 tablet (4 mg total) by mouth every 8 (eight) hours as needed for nausea or vomiting. 07/18/17   Isla Pence, MD  pantoprazole (PROTONIX) 40 MG tablet Take 1 tablet (40 mg total) by mouth 2 (two) times daily. 05/15/17 06/14/17  Minus Liberty, MD  Family History Family History  Problem Relation Age of Onset  . Hypertension Mother     Social History Social History   Tobacco Use  . Smoking status: Current Every Day Smoker    Packs/day: 0.10    Years: 32.00    Pack years: 3.20    Types: Cigarettes  . Smokeless tobacco: Never Used  Substance Use Topics  . Alcohol use: Yes    Alcohol/week: 12.0 oz    Types: 20 Shots of liquor per week  . Drug use: No     Allergies   Codeine   Review of Systems Review of Systems Ten systems reviewed and are negative for acute change, except as noted in the HPI.    Physical  Exam Updated Vital Signs BP (!) 148/98   Pulse (!) 127   Temp 98.6 F (37 C) (Oral)   Resp 12   Ht 5\' 5"  (1.651 m)   Wt 77.6 kg (171 lb)   SpO2 92%   BMI 28.46 kg/m   Physical Exam  Constitutional: She is oriented to person, place, and time. She appears well-developed and well-nourished. No distress.  Tremulous, tachycardic female who appears to be in alcohol withdrawal  HENT:  Head: Normocephalic and atraumatic.  Eyes: Conjunctivae and EOM are normal. Pupils are equal, round, and reactive to light. No scleral icterus.  Neck: Normal range of motion. Neck supple.  Cardiovascular: Normal rate, regular rhythm and normal heart sounds. Exam reveals no gallop and no friction rub.  No murmur heard. Pulmonary/Chest: Effort normal and breath sounds normal. No respiratory distress.  Abdominal: Soft. Bowel sounds are normal. She exhibits no distension and no mass. There is tenderness (RUQ). There is no guarding.  Neurological: She is alert and oriented to person, place, and time.  Skin: Skin is warm and dry. She is not diaphoretic.  Psychiatric: Her behavior is normal.  Nursing note and vitals reviewed.    ED Treatments / Results  Labs (all labs ordered are listed, but only abnormal results are displayed) Labs Reviewed  COMPREHENSIVE METABOLIC PANEL - Abnormal; Notable for the following components:      Result Value   Potassium 3.3 (*)    Chloride 98 (*)    Glucose, Bld 147 (*)    BUN <5 (*)    Albumin 3.2 (*)    AST 339 (*)    ALT 121 (*)    Alkaline Phosphatase 213 (*)    Anion gap 17 (*)    All other components within normal limits  ETHANOL - Abnormal; Notable for the following components:   Alcohol, Ethyl (B) 299 (*)    All other components within normal limits  CBC - Abnormal; Notable for the following components:   MCV 108.9 (*)    MCH 37.9 (*)    Platelets 448 (*)    All other components within normal limits  RAPID URINE DRUG SCREEN, HOSP PERFORMED    EKG  EKG  Interpretation None       Radiology No results found.  Procedures Procedures (including critical care time)  Medications Ordered in ED Medications - No data to display   Initial Impression / Assessment and Plan / ED Course  I have reviewed the triage vital signs and the nursing notes.  Pertinent labs & imaging results that were available during my care of the patient were reviewed by me and considered in my medical decision making (see chart for details).  Clinical Course as of Nov 10 134  [facility]  Nov 10, 2017  0031 Patient with unstable vitals. Improved on CIWA protocol. Patient in acute withdraw at an etoh level of 299  [AH]  0117 Patient will be admitted by Dr. Blaine Hamper. Her CIWA score is currently 16  [AH]    Clinical Course User Index [AH] Margarita Mail, PA-C      Final Clinical Impressions(s) / ED Diagnoses   Final diagnoses:  None    ED Discharge Orders    None       Margarita Mail, PA-C 11/10/17 [id number]    Charlesetta Shanks, MD 11/11/17 1102

## 2017-11-09 NOTE — ED Triage Notes (Addendum)
Pt arrives via POV from home with a drinking problem. Pt reports drinks approx 15shots/vodka per day. States wants detox but worried she may have a seizure if she detoxes on her home. Pt reports recent stressors as currently moving, mother dying and father with dementia. Pt denies SI/HI. Last drink 40min ago. Pt alert, oriented x4, calm, cooperative.

## 2017-11-09 NOTE — ED Notes (Signed)
Patient denies pain and is resting comfortably.  

## 2017-11-09 NOTE — ED Notes (Signed)
Patient transported to CT 

## 2017-11-10 DIAGNOSIS — E876 Hypokalemia: Secondary | ICD-10-CM

## 2017-11-10 DIAGNOSIS — R7989 Other specified abnormal findings of blood chemistry: Secondary | ICD-10-CM | POA: Diagnosis present

## 2017-11-10 DIAGNOSIS — R945 Abnormal results of liver function studies: Secondary | ICD-10-CM | POA: Diagnosis present

## 2017-11-10 DIAGNOSIS — Z8673 Personal history of transient ischemic attack (TIA), and cerebral infarction without residual deficits: Secondary | ICD-10-CM | POA: Diagnosis not present

## 2017-11-10 DIAGNOSIS — F10230 Alcohol dependence with withdrawal, uncomplicated: Secondary | ICD-10-CM | POA: Diagnosis not present

## 2017-11-10 DIAGNOSIS — F319 Bipolar disorder, unspecified: Secondary | ICD-10-CM | POA: Diagnosis present

## 2017-11-10 DIAGNOSIS — R109 Unspecified abdominal pain: Secondary | ICD-10-CM | POA: Diagnosis present

## 2017-11-10 DIAGNOSIS — F17200 Nicotine dependence, unspecified, uncomplicated: Secondary | ICD-10-CM | POA: Diagnosis not present

## 2017-11-10 DIAGNOSIS — F17210 Nicotine dependence, cigarettes, uncomplicated: Secondary | ICD-10-CM | POA: Diagnosis present

## 2017-11-10 DIAGNOSIS — Z885 Allergy status to narcotic agent status: Secondary | ICD-10-CM | POA: Diagnosis not present

## 2017-11-10 DIAGNOSIS — I10 Essential (primary) hypertension: Secondary | ICD-10-CM | POA: Diagnosis present

## 2017-11-10 DIAGNOSIS — K219 Gastro-esophageal reflux disease without esophagitis: Secondary | ICD-10-CM | POA: Diagnosis present

## 2017-11-10 DIAGNOSIS — Y908 Blood alcohol level of 240 mg/100 ml or more: Secondary | ICD-10-CM | POA: Diagnosis present

## 2017-11-10 DIAGNOSIS — F10239 Alcohol dependence with withdrawal, unspecified: Secondary | ICD-10-CM | POA: Diagnosis present

## 2017-11-10 DIAGNOSIS — F4310 Post-traumatic stress disorder, unspecified: Secondary | ICD-10-CM | POA: Diagnosis present

## 2017-11-10 DIAGNOSIS — K2920 Alcoholic gastritis without bleeding: Secondary | ICD-10-CM | POA: Diagnosis present

## 2017-11-10 LAB — BASIC METABOLIC PANEL
Anion gap: 14 (ref 5–15)
CO2: 24 mmol/L (ref 22–32)
Calcium: 7.9 mg/dL — ABNORMAL LOW (ref 8.9–10.3)
Chloride: 100 mmol/L — ABNORMAL LOW (ref 101–111)
Creatinine, Ser: 0.51 mg/dL (ref 0.44–1.00)
Glucose, Bld: 99 mg/dL (ref 65–99)
POTASSIUM: 3 mmol/L — AB (ref 3.5–5.1)
SODIUM: 138 mmol/L (ref 135–145)

## 2017-11-10 LAB — TSH: TSH: 4.066 u[IU]/mL (ref 0.350–4.500)

## 2017-11-10 LAB — PROTIME-INR
INR: 1.09
Prothrombin Time: 14 seconds (ref 11.4–15.2)

## 2017-11-10 LAB — CBC
HEMATOCRIT: 38.3 % (ref 36.0–46.0)
Hemoglobin: 12.9 g/dL (ref 12.0–15.0)
MCH: 36.9 pg — AB (ref 26.0–34.0)
MCHC: 33.7 g/dL (ref 30.0–36.0)
MCV: 109.4 fL — AB (ref 78.0–100.0)
PLATELETS: 313 10*3/uL (ref 150–400)
RBC: 3.5 MIL/uL — AB (ref 3.87–5.11)
RDW: 13.6 % (ref 11.5–15.5)
WBC: 7.3 10*3/uL (ref 4.0–10.5)

## 2017-11-10 LAB — CBG MONITORING, ED: Glucose-Capillary: 109 mg/dL — ABNORMAL HIGH (ref 65–99)

## 2017-11-10 LAB — HCG, QUANTITATIVE, PREGNANCY: HCG, BETA CHAIN, QUANT, S: 1 m[IU]/mL (ref <5)

## 2017-11-10 LAB — HIV ANTIBODY (ROUTINE TESTING W REFLEX): HIV Screen 4th Generation wRfx: NONREACTIVE

## 2017-11-10 LAB — MAGNESIUM: MAGNESIUM: 1.5 mg/dL — AB (ref 1.7–2.4)

## 2017-11-10 MED ORDER — SODIUM CHLORIDE 0.9 % IV SOLN
INTRAVENOUS | Status: DC
  Administered 2017-11-10: 06:00:00 via INTRAVENOUS

## 2017-11-10 MED ORDER — MORPHINE SULFATE (PF) 4 MG/ML IV SOLN: 2.0000 mg | INTRAVENOUS | Status: DC | PRN

## 2017-11-10 MED ORDER — HYDROXYZINE HCL 50 MG/ML IM SOLN: 25.0000 mg | Freq: Four times a day (QID) | INTRAMUSCULAR | Status: DC | PRN

## 2017-11-10 MED ORDER — CHLORDIAZEPOXIDE HCL 5 MG PO CAPS: 10.0000 mg | ORAL_CAPSULE | Freq: Once | ORAL | Status: DC

## 2017-11-10 MED ORDER — LORAZEPAM 2 MG/ML IJ SOLN
0.0000 mg | INTRAMUSCULAR | Status: DC | PRN
  Filled 2017-11-10: qty 1

## 2017-11-10 MED ORDER — MAGNESIUM SULFATE 2 GM/50ML IV SOLN
2.0000 g | Freq: Once | INTRAVENOUS | Status: AC
  Administered 2017-11-10: 2 g via INTRAVENOUS
  Filled 2017-11-10: qty 50

## 2017-11-10 MED ORDER — ZOLPIDEM TARTRATE 5 MG PO TABS
5.0000 mg | ORAL_TABLET | Freq: Every evening | ORAL | Status: DC | PRN
  Filled 2017-11-10: qty 1

## 2017-11-10 MED ORDER — SODIUM CHLORIDE 0.9 % IV SOLN
INTRAVENOUS | Status: DC
  Administered 2017-11-10 – 2017-11-11 (×4): via INTRAVENOUS
  Filled 2017-11-10 (×7): qty 1000

## 2017-11-10 MED ORDER — PANTOPRAZOLE SODIUM 40 MG IV SOLR
40.0000 mg | Freq: Two times a day (BID) | INTRAVENOUS | Status: DC
  Administered 2017-11-10 – 2017-11-11 (×3): 40 mg via INTRAVENOUS
  Filled 2017-11-10 (×3): qty 40

## 2017-11-10 MED ORDER — FLUOXETINE HCL 20 MG PO CAPS
20.0000 mg | ORAL_CAPSULE | Freq: Every day | ORAL | Status: DC
  Administered 2017-11-11 – 2017-11-12 (×2): 20 mg via ORAL
  Filled 2017-11-10 (×2): qty 1

## 2017-11-10 MED ORDER — LORAZEPAM 2 MG/ML IJ SOLN
0.0000 mg | Freq: Four times a day (QID) | INTRAMUSCULAR | Status: DC
  Administered 2017-11-10: 2 mg via INTRAVENOUS

## 2017-11-10 MED ORDER — CHLORDIAZEPOXIDE HCL 5 MG PO CAPS
5.0000 mg | ORAL_CAPSULE | Freq: Four times a day (QID) | ORAL | Status: DC | PRN
  Administered 2017-11-10: 5 mg via ORAL
  Filled 2017-11-10: qty 1

## 2017-11-10 MED ORDER — LORAZEPAM 2 MG/ML IJ SOLN
1.0000 mg | Freq: Four times a day (QID) | INTRAMUSCULAR | Status: DC | PRN
Start: 2017-11-10 — End: 2017-11-12
  Filled 2017-11-10: qty 1

## 2017-11-10 MED ORDER — LORAZEPAM 2 MG/ML IJ SOLN: 0.0000 mg | Freq: Two times a day (BID) | INTRAMUSCULAR | Status: DC

## 2017-11-10 MED ORDER — HYDRALAZINE HCL 20 MG/ML IJ SOLN: 5.0000 mg | INTRAMUSCULAR | Status: DC | PRN

## 2017-11-10 MED ORDER — DIAZEPAM 5 MG PO TABS
5.0000 mg | ORAL_TABLET | Freq: Four times a day (QID) | ORAL | Status: AC
  Administered 2017-11-10 – 2017-11-12 (×5): 5 mg via ORAL
  Filled 2017-11-10 (×6): qty 1

## 2017-11-10 MED ORDER — LORAZEPAM 1 MG PO TABS
1.0000 mg | ORAL_TABLET | Freq: Four times a day (QID) | ORAL | Status: DC | PRN
  Administered 2017-11-11 (×2): 1 mg via ORAL
  Filled 2017-11-10 (×2): qty 1

## 2017-11-10 MED ORDER — SODIUM CHLORIDE 0.9 % IV BOLUS (SEPSIS)
1500.0000 mL | Freq: Once | INTRAVENOUS | Status: AC
  Administered 2017-11-10: 1500 mL via INTRAVENOUS

## 2017-11-10 MED ORDER — LORAZEPAM 2 MG/ML IJ SOLN
INTRAMUSCULAR | Status: AC
  Filled 2017-11-10: qty 1

## 2017-11-10 MED ORDER — SUCRALFATE 1 GM/10ML PO SUSP
1.0000 g | Freq: Three times a day (TID) | ORAL | Status: DC
  Administered 2017-11-11 – 2017-11-12 (×6): 1 g via ORAL
  Filled 2017-11-10 (×7): qty 10

## 2017-11-10 MED ORDER — ENOXAPARIN SODIUM 40 MG/0.4ML ~~LOC~~ SOLN
40.0000 mg | SUBCUTANEOUS | Status: DC
  Administered 2017-11-11: 40 mg via SUBCUTANEOUS
  Filled 2017-11-10 (×2): qty 0.4

## 2017-11-10 MED ORDER — LORAZEPAM 2 MG/ML IJ SOLN
2.0000 mg | Freq: Once | INTRAMUSCULAR | Status: AC
  Administered 2017-11-10: 2 mg via INTRAVENOUS
  Filled 2017-11-10: qty 1

## 2017-11-10 MED ORDER — HYDROXYZINE HCL 10 MG PO TABS: 10.0000 mg | ORAL_TABLET | Freq: Three times a day (TID) | ORAL | Status: DC | PRN

## 2017-11-10 MED ORDER — CHLORDIAZEPOXIDE HCL 5 MG PO CAPS: 5.0000 mg | ORAL_CAPSULE | Freq: Three times a day (TID) | ORAL | Status: DC

## 2017-11-10 MED ORDER — LORAZEPAM 2 MG/ML IJ SOLN
0.0000 mg | INTRAMUSCULAR | Status: AC | PRN
  Administered 2017-11-10: 1 mg via INTRAVENOUS
  Administered 2017-11-10: 2 mg via INTRAVENOUS
  Administered 2017-11-10: 1 mg via INTRAVENOUS
  Administered 2017-11-11: 2 mg via INTRAVENOUS
  Filled 2017-11-10 (×2): qty 1

## 2017-11-10 MED ORDER — POTASSIUM CHLORIDE 20 MEQ/15ML (10%) PO SOLN: 40.0000 meq | Freq: Once | ORAL | Status: DC

## 2017-11-10 MED ORDER — ADULT MULTIVITAMIN W/MINERALS CH
1.0000 | ORAL_TABLET | Freq: Every day | ORAL | Status: DC
  Administered 2017-11-10 – 2017-11-12 (×3): 1 via ORAL
  Filled 2017-11-10 (×3): qty 1

## 2017-11-10 MED ORDER — DIAZEPAM 5 MG/ML IJ SOLN: 2.5000 mg | Freq: Four times a day (QID) | INTRAMUSCULAR | Status: DC | PRN

## 2017-11-10 MED ORDER — CLONIDINE HCL 0.1 MG PO TABS
0.1000 mg | ORAL_TABLET | Freq: Three times a day (TID) | ORAL | Status: DC
  Administered 2017-11-10 – 2017-11-12 (×6): 0.1 mg via ORAL
  Filled 2017-11-10 (×7): qty 1

## 2017-11-10 MED ORDER — NICOTINE 21 MG/24HR TD PT24
21.0000 mg | MEDICATED_PATCH | Freq: Every day | TRANSDERMAL | Status: DC
  Filled 2017-11-10 (×3): qty 1

## 2017-11-10 NOTE — ED Notes (Signed)
Patient denies pain and is resting comfortably.  

## 2017-11-10 NOTE — Progress Notes (Signed)
Patient seen and examined  She is a 51 year old female with a history of hypertension TIA, GERD, seizure, alcohol abuse, tobacco abuse, tachycardia, depression, who presents with alcohol withdrawal and abdominal pain, nausea vomiting ED Course: pt was found to have alcohol level 299, negative UDS, lipase 28, WBC 8.3, abnormal liver functions with ALP 213, AST 337, ALT 121, total bilirubin 1.1, potassium 3.3, creatinine normal  Assessment and plan Alcohol withdrawal ([place]): - Continue telemetry - Continue CIWA protocol  - Added Valium orally scheduled   Added Valium IV prn - Thiamine & Folate - Zofran for N/V  - IVF: Change IV fluids to normal saline with potassium - Start clonidine 0.1 mg 3 times a day for hypertension  - consult to SW  Hypertension: bp 162/116-->149/108 now. Not taking meds at home - Start clonidine 0.1 mg 3 times a day for hypertension  -IV hydralazine when necessary  Hypokalemia: K= 3.3 on admission. - Repleted with IV fluids - Magnesium 1.5, replete  Tobacco abuse -Did counseling about importance of quitting smoking -Nicotine patch  Abnormal LFTs: Most likely due to alcohol abuse given typical visual for AST/ALT. - check HIV and hepatitis panel -check   Abdominal pain, nausea and vomiting: Etiology is not clear. Differential diagnoses include mild alcoholic hepatitis and possible alcoholic gastritis. -IV Protonix , add Carafate  -When necessary hydroxyzine for nausea and vomiting and morphine for pain (patient has inausea and vomiting, cannot take oral pain medication consistently)   Sinus tachycardia: likely due to alcohol withdrawal. -Telemetry monitor -IV fluid as above -Started clonidine which may help

## 2017-11-10 NOTE — H&P (Signed)
History and Physical    Cynthia Schroeder JSR:[id number] DOB: 1966-05-07 DOA: 11/09/2017  Referring MD/NP/PA:   PCP: Leeroy Cha, MD   Patient coming from:  The patient is coming from home.  At baseline, pt is independent for most of ADL.   Chief Complaint: Alcohol withdrawal, abdominal pain, nausea, vomiting   HPI: Cynthia Schroeder is a 51 y.o. female with medical history significant of hypertension, TIA, GERD, seizure, alcohol abuse, tobacco abuse, tachycardia, depression, who presents with alcohol withdrawal and abdominal pain, nausea vomiting  Patient states that she has been drinking heavily in the past 4 days. She drinks about 15 airplane-sized bottles of vodka daily. The patient states that she has not been able to eat for the past 4 days since she has intractable nausea, vomiting and abdominal pain. She vomited approximately 4-5 times each day. Her abdominal pain is located in the central abdomen and right upper quadrant, 5 out of 10 in severity, sharp, nonradiating. No diarrhea. She denies chest pain, shortness breath, cough, fever or chills. No symptoms of UTI or unilateral weakness. Patient is tremulous when I saw her in ED.  ED Course: pt was found to have alcohol level 299, negative UDS, lipase 28, WBC 8.3, abnormal liver functions with ALP 213, AST 337, ALT 121, total bilirubin 1.1, potassium 3.3, creatinine normal. Temperature normal, tachycardia with heart rate up to 150--> 134, tachypnea, oxygen saturation 85-93% on room air. CT of abdomen pelvis is not impressive. Patient is admitted to telemetry bed as inpatient.  Review of Systems:   General: no fevers, chills, no body weight gain, has poor appetite, has fatigue HEENT: no blurry vision, hearing changes or sore throat Respiratory: no dyspnea, coughing, wheezing CV: no chest pain, no palpitations GI: has nausea, vomiting, abdominal pain, diarrhea, no constipation GU: no dysuria, burning on urination, increased  urinary frequency, hematuria  Ext: no leg edema Neuro: no unilateral weakness, numbness, or tingling, no vision change or hearing loss. Tremulous. Skin: no rash, no skin tear. MSK: No muscle spasm, no deformity, no limitation of range of movement in spin Heme: No easy bruising.  Travel history: No recent long distant travel.  Allergy:  Allergies  Allergen Reactions  . Codeine Itching and Rash    Past Medical History:  Diagnosis Date  . Abnormal liver enzymes   . Anxiety   . ETOH abuse   . Hypertension   . Seizures ([place])   . Tachycardia, unspecified   . TIA (transient ischemic attack)     Past Surgical History:  Procedure Laterality Date  . ANTERIOR CRUCIATE LIGAMENT REPAIR Right     Social History:  reports that she has been smoking cigarettes.  She has a 3.20 pack-year smoking history. she has never used smokeless tobacco. She reports that she drinks about 12.0 oz of alcohol per week. She reports that she does not use drugs.  Family History:  Family History  Problem Relation Age of Onset  . Hypertension Mother      Prior to Admission medications   Medication Sig Start Date End Date Taking? Authorizing Provider  Multiple Vitamins-Minerals (MULTI-VITAMIN GUMMIES) CHEW Chew 2 tablets daily by mouth.    Yes [provider]  chlordiazePOXIDE (LIBRIUM) 25 MG capsule 50mg  PO TID x 1D, then 25-50mg  PO BID X 1D, then 25-50mg  PO QD X 1D Patient not taking: Reported on 11/09/2017 07/18/17   Isla Pence, MD  famotidine (PEPCID) 20 MG tablet Take 1 tablet (20 mg total) by mouth 2 (two)  times daily. Patient not taking: Reported on 11/09/2017 07/18/17   Isla Pence, MD  FLUoxetine (PROZAC) 20 MG capsule Take 1 capsule (20 mg total) by mouth daily. Patient not taking: Reported on 05/12/2017 02/28/17   Damita Lack, MD  nicotine (NICODERM CQ - DOSED IN MG/24 HOURS) 14 mg/24hr patch Place 1 patch (14 mg total) onto the skin daily. Patient not taking: Reported on  05/12/2017 03/01/17   Damita Lack, MD  ondansetron (ZOFRAN ODT) 4 MG disintegrating tablet Take 1 tablet (4 mg total) by mouth every 8 (eight) hours as needed for nausea or vomiting. Patient not taking: Reported on 11/09/2017 07/18/17   Isla Pence, MD    Physical Exam: Vitals:   11/10/17 0045 11/10/17 0100 11/10/17 0115 11/10/17 0130  BP:  (!) 151/101  (!) 149/110  Pulse: (!) 135 (!) 133 (!) 134 (!) 138  Resp: 16 17 (!) 22 19  Temp:      TempSrc:      SpO2: 94% (!) 88% (!) 89% (!) 89%  Weight:      Height:       General: Not in acute distress. Tremulous. HEENT:       Eyes: PERRL, EOMI, no scleral icterus.       ENT: No discharge from the ears and nose, no pharynx injection, no tonsillar enlargement.        Neck: No JVD, no bruit, no mass felt. Heme: No neck lymph node enlargement. Cardiac: S1/S2, RRR,  tachycardia, No murmurs, No gallops or rubs. Respiratory: No rales, wheezing, rhonchi or rubs. GI: Soft, nondistended, nontender, no rebound pain, no organomegaly, BS present. GU: No hematuria Ext: No pitting leg edema bilaterally. 2+DP/PT pulse bilaterally. Musculoskeletal: No joint deformities, No joint redness or warmth, no limitation of ROM in spin. Skin: No rashes.  Neuro: Alert, oriented X3, cranial nerves II-XII grossly intact, moves all extremities normally.  Psych: Patient is not psychotic, no suicidal or hemocidal ideation.  Labs on Admission: I have personally reviewed following labs and imaging studies  CBC: Recent Labs  Lab 11/09/17 1854  WBC 8.3  HGB 14.9  HCT 42.8  MCV 108.9*  PLT 782*   Basic Metabolic Panel: Recent Labs  Lab 11/09/17 1854  NA 141  K 3.3*  CL 98*  CO2 26  GLUCOSE 147*  BUN <5*  CREATININE 0.57  CALCIUM 8.9   GFR: Estimated Creatinine Clearance: 87.6 mL/min (by C-G formula based on SCr of 0.57 mg/dL). Liver Function Tests: Recent Labs  Lab 11/09/17 1854  AST 339*  ALT 121*  ALKPHOS 213*  BILITOT 1.1  PROT 7.6    ALBUMIN 3.2*   Recent Labs  Lab 11/09/17 2215  LIPASE 28   No results for input(s): AMMONIA in the last 168 hours. Coagulation Profile: No results for input(s): INR, PROTIME in the last 168 hours. Cardiac Enzymes: No results for input(s): CKTOTAL, CKMB, CKMBINDEX, TROPONINI in the last 168 hours. BNP (last 3 results) No results for input(s): PROBNP in the last 8760 hours. HbA1C: No results for input(s): HGBA1C in the last 72 hours. CBG: No results for input(s): GLUCAP in the last 168 hours. Lipid Profile: No results for input(s): CHOL, HDL, LDLCALC, TRIG, CHOLHDL, LDLDIRECT in the last 72 hours. Thyroid Function Tests: No results for input(s): TSH, T4TOTAL, FREET4, T3FREE, THYROIDAB in the last 72 hours. Anemia Panel: No results for input(s): VITAMINB12, FOLATE, FERRITIN, TIBC, IRON, RETICCTPCT in the last 72 hours. Urine analysis:    Component Value Date/Time  COLORURINE YELLOW 07/18/2017 1214   APPEARANCEUR HAZY (A) 07/18/2017 1214   LABSPEC 1.021 07/18/2017 1214   PHURINE 5.0 07/18/2017 1214   GLUCOSEU NEGATIVE 07/18/2017 1214   HGBUR SMALL (A) 07/18/2017 1214   BILIRUBINUR NEGATIVE 07/18/2017 1214   KETONESUR 80 (A) 07/18/2017 1214   PROTEINUR 100 (A) 07/18/2017 1214   NITRITE NEGATIVE 07/18/2017 1214   LEUKOCYTESUR NEGATIVE 07/18/2017 1214   Sepsis Labs: @LABRCNTIP (procalcitonin:4,lacticidven:4) )No results found for this or any previous visit (from the past 240 hour(s)).   Radiological Exams on Admission: Ct Abdomen Pelvis W Contrast  Result Date: 11/10/2017 CLINICAL DATA:  Mid central abdominal pain for 2 days. Nausea and vomiting. History of a drinking problem and wants detox. EXAM: CT ABDOMEN AND PELVIS WITH CONTRAST TECHNIQUE: Multidetector CT imaging of the abdomen and pelvis was performed using the standard protocol following bolus administration of intravenous contrast. CONTRAST:  156mL ISOVUE-300 IOPAMIDOL (ISOVUE-300) INJECTION 61% COMPARISON:   05/12/2017 FINDINGS: Lower chest: Atelectasis in the lung bases. Hepatobiliary: Prominent diffuse fatty infiltration of the liver. No focal lesions. Gallbladder and bile ducts are unremarkable. Pancreas: Unremarkable. No pancreatic ductal dilatation or surrounding inflammatory changes. Spleen: Normal in size without focal abnormality. Adrenals/Urinary Tract: Adrenal glands are unremarkable. Kidneys are normal, without renal calculi, focal lesion, or hydronephrosis. Bladder is unremarkable. Stomach/Bowel: Stomach is within normal limits. Appendix appears normal. No evidence of bowel wall thickening, distention, or inflammatory changes. Vascular/Lymphatic: Aortic atherosclerosis. No enlarged abdominal or pelvic lymph nodes. Reproductive: Uterus and bilateral adnexa are unremarkable. Other: No abdominal wall hernia or abnormality. No abdominopelvic ascites. Musculoskeletal: Degenerative changes in the spine. No destructive bone lesions. IMPRESSION: 1. No acute process demonstrated in the abdomen or pelvis. 2. Diffuse fatty infiltration of the liver. 3. Aortic atherosclerosis. Electronically Signed   By: Lucienne Capers M.D.   On: 11/10/2017 00:03     EKG: Independently reviewed.  Sinus tachycardia, QTC 517, nonspecific T-wave change.   Assessment/Plan Principal Problem:   Alcohol withdrawal ([facility]) Active Problems:   Hypertension   Hypokalemia   Current smoker   Abnormal LFTs   Abdominal pain   Alcohol withdrawal ([place]): - Will admit to telemetry  - Continue CIWA protocol  - start Librium 10 mg 3 times a day - Thiamine & Folate - Zofran for N/V  - IVF: 2.5 L NS and then 100 cc/hr NS - Start clonidine 0.1 mg 3 times a day for hypertension  - consult to SW  Hypertension: bp 162/116-->149/108 now. Not taking meds at home - Start clonidine 0.1 mg 3 times a day for hypertension  -IV hydralazine when necessary  Hypokalemia: K= 3.3 on admission. - Repleted - Check Mg level  Tobacco  abuse -Did counseling about importance of quitting smoking -Nicotine patch  Abnormal LFTs: Most likely due to alcohol abuse given typical visual for AST/ALT. - check HIV and hepatitis panel -check   Abdominal pain, nausea and vomiting: Etiology is not clear. Differential diagnoses include mild alcoholic hepatitis and possible alcoholic gastritis. -IV Protonix  -When necessary hydroxyzine for nausea and vomiting and morphine for pain (patient has inausea and vomiting, cannot take oral pain medication consistently)   Sinus tachycardia: likely due to alcohol withdrawal. -Telemetry monitor -IV fluid as above -Started clonidine which may help   DVT ppx:  SQ Lovenox Code Status: Full code Family Communication: None at bed side. Disposition Plan:  Anticipate discharge back to previous home environment Consults called:  none Admission status:  Inpatient/tele    Date of Service 11/10/2017  Ivor Costa [facility] Pager [phone]  If 7PM-7AM, please contact night-coverage [email or web address] Password [email or web address] 11/10/2017, 2:36 AM

## 2017-11-10 NOTE — ED Notes (Signed)
Pt able to use bedside toilet with one assist,tolerated well.

## 2017-11-10 NOTE — ED Notes (Signed)
Paged and spoke with Dr. Blaine Hamper about pt status. Aware of CIWA score. Meds given per protocol.

## 2017-11-10 NOTE — Progress Notes (Addendum)
Type of Service:Brief Assessment    SUBJECTIVE: Cynthia Schroeder is a 51 y.o. female referred by doctor for: substance abuse (Alcohol Abuse). symptoms of very fragile , weak, shakes.  Stressors related to: included moving, taken care of mom.  Patient was accompanied by no one. Patient reports the following symptoms/concerns act passing away within a year and having to take on a new role as care giver for pt's mother. .   Duration of problem:  Started drinking at the age of 79, but reports drinking more at the age of 10.   LIFE CONTEXT:  Family & Social: lives in a first floor apartment alone. Support is mom Leda Gauze).  School/ Work: no Life changes: moving and having to care for pt's mom.     INTERVENTION:  Reflective listening,  ISSUES DISCUSSED: drinking patters,stressors, time period of drinking ,avalaible resources for pt and other options to drinking.    ASSESSMENT:Patient currently experiencing alcohol issues.  Symptoms exacerbated by a number of stressors/triggers that pt is not able to identify at this time. Patient may benefit from outpatient substance use resources and is in agreement to receive treatment when ready.      PLAN: No further intervention required at this time . Referral; CSW provided pt with outpatient substance abuse resources as requested by pt.       Virgie Dad Brittony Billick, MSW, [facility] Clinical Social Worker [phone]

## 2017-11-10 NOTE — ED Notes (Signed)
Blood clotted recollect.

## 2017-11-10 NOTE — ED Notes (Signed)
Heart Healthy diet breakfast tray ordered, @ 1203.

## 2017-11-10 NOTE — ED Notes (Signed)
Pt is shaking, nervous, states had a seizure in past from withdrawal, and "I had to be tied down" message sent to MD for more meds.

## 2017-11-10 NOTE — ED Notes (Signed)
Patient CBG was 109, nurse notified.

## 2017-11-11 ENCOUNTER — Encounter (HOSPITAL_COMMUNITY): Payer: Self-pay

## 2017-11-11 LAB — COMPREHENSIVE METABOLIC PANEL
ALT: 81 U/L — ABNORMAL HIGH (ref 14–54)
ANION GAP: 5 (ref 5–15)
AST: 224 U/L — ABNORMAL HIGH (ref 15–41)
Albumin: 2.6 g/dL — ABNORMAL LOW (ref 3.5–5.0)
Alkaline Phosphatase: 162 U/L — ABNORMAL HIGH (ref 38–126)
BUN: <5 mg/dL — ABNORMAL LOW (ref 6–20)
CALCIUM: 7.9 mg/dL — AB (ref 8.9–10.3)
CO2: 23 mmol/L (ref 22–32)
CREATININE: 0.46 mg/dL (ref 0.44–1.00)
Chloride: 109 mmol/L (ref 101–111)
GFR calc Af Amer: >60 mL/min (ref >60)
GFR calc non Af Amer: >60 mL/min (ref >60)
GLUCOSE: 85 mg/dL (ref 65–99)
Potassium: 4.6 mmol/L (ref 3.5–5.1)
SODIUM: 137 mmol/L (ref 135–145)
Total Bilirubin: 1.9 mg/dL — ABNORMAL HIGH (ref 0.3–1.2)
Total Protein: 6 g/dL — ABNORMAL LOW (ref 6.5–8.1)

## 2017-11-11 LAB — HEPATITIS PANEL, ACUTE
HEP B C IGM: NEGATIVE
HEP B S AG: NEGATIVE
Hep A IgM: NEGATIVE

## 2017-11-11 LAB — GLUCOSE, CAPILLARY: Glucose-Capillary: 93 mg/dL (ref 65–99)

## 2017-11-11 LAB — MRSA PCR SCREENING: MRSA by PCR: NEGATIVE

## 2017-11-11 MED ORDER — PANTOPRAZOLE SODIUM 40 MG PO TBEC: 40.0000 mg | DELAYED_RELEASE_TABLET | Freq: Two times a day (BID) | ORAL | Status: DC

## 2017-11-11 MED ORDER — PANTOPRAZOLE SODIUM 40 MG PO TBEC
40.0000 mg | DELAYED_RELEASE_TABLET | Freq: Two times a day (BID) | ORAL | Status: DC
  Administered 2017-11-11 – 2017-11-12 (×2): 40 mg via ORAL
  Filled 2017-11-11 (×2): qty 1

## 2017-11-11 NOTE — Progress Notes (Signed)
Paged Physician, he will come speak to pt concerning risk of leaving AMA.

## 2017-11-11 NOTE — Care Management Note (Signed)
Case Management Note  Patient Details  Name: Cynthia Schroeder MRN: [id number] Date of Birth: 1966/10/09  Subjective/Objective:  From home presents  with alcohol withdrawal abdominal pain nausea vomiting blood alcohol level 299, lipase 28, wbc 8.3, elevated LFT.                   Action/Plan: NCM will follow for dc needs.  Expected Discharge Date:                  Expected Discharge Plan:  Home/Self Care  In-House Referral:     Discharge planning Services  CM Consult  Post Acute Care Choice:    Choice offered to:     DME Arranged:    DME Agency:     HH Arranged:    HH Agency:     Status of Service:  In process, will continue to follow  If discussed at Long Length of Stay Meetings, dates discussed:    Additional Comments:  Zenon Mayo, RN 11/11/2017, 4:59 PM

## 2017-11-11 NOTE — Progress Notes (Signed)
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  HTN, Bipolar TIA,  reflux,  EtOH abuse with probable withdrawal seizures on admission 02/28/2017 Previously had been in 28-day program for alcohol cessation  Last admitted 04/2017 for withdrawal  Readmitted 11/10/2017 with alcohol withdrawal abdominal pain nausea vomiting Blood alcohol level 299 lipase 28 WBC 8.3 elevated LFTs AST/ALT 337/121 heart rate 150s CT abdomen pelvis no new findings   Assessment & Plan:   Principal Problem:   Alcohol withdrawal ([place]) Active Problems:   Hypertension   Hypokalemia   Current smoker   Abnormal LFTs   Abdominal pain   ETOH withdrawal ciwa LOWER IN 4 RANGE BUT AS HGH AS 11 EARLIER TODAY Patient wanted to leave hospital-recommend staying--she has elected to stay for now transfer to [place] in am  N/v and probable etoh gastritis- Improved-eating soft diet Cont protonix 40 daily placed on carafate this admit 1 g tid and continue  tob abuse Cont nicotine patch  Sinus tach Likely 2/2 withdrawal Cont clonidone 1 mg staretd earlier in admit  D/c tele-transfer to med surg     Consultants:   none  Procedures:   none  Antimicrobials:   none    Subjective: Awake alert askig about home eating drinking in nad no vomit no diarr  Objective: Vitals:   11/11/17 0130 11/11/17 0145 11/11/17 0333 11/11/17 0731  BP: (!) 145/94  (!) 148/101 (!) 145/97  Pulse: 89 99 89 91  Resp:  16 17 (!) 22  Temp: 97.9 F (36.6 C)  97.9 F (36.6 C) 98.8 F (37.1 C)  TempSrc: Oral  Oral Oral  SpO2: (!) 87% 98% 96% 94%  Weight: 78.4 kg (172 lb 13.5 oz)     Height:        Intake/Output Summary (Last 24 hours) at 11/11/2017 0814 Last data filed at 11/11/2017 0600 Gross per 24 hour  Intake 3070 ml  Output -  Net 3070 ml    Filed Weights   11/09/17 1843 11/11/17 0130  Weight: 77.6 kg (171 lb) 78.4 kg (172 lb 13.5 oz)    Examination:  Alert pleasant no ict no pallor eomi ncat abd soft nt nd no rebound cta b   Data Reviewed: I have personally reviewed following labs and imaging studies  CBC: Recent Labs  Lab 11/09/17 1854 11/10/17 0239  WBC 8.3 7.3  HGB 14.9 12.9  HCT 42.8 38.3  MCV 108.9* 109.4*  PLT 448* 433   Basic Metabolic Panel: Recent Labs  Lab 11/09/17 1854 11/10/17 0239 11/11/17 0627  NA 141 138 137  K 3.3* 3.0* 4.6  CL 98* 100* 109  CO2 26 24 23   GLUCOSE 147* 99 85  BUN <5* <5* <5*  CREATININE 0.57 0.51 0.46  CALCIUM 8.9 7.9* 7.9*  MG  --  1.5*  --    GFR: Estimated Creatinine Clearance: 88.1 mL/min (by C-G formula based on SCr of 0.46 mg/dL). Liver Function Tests: Recent Labs  Lab 11/09/17 1854 11/11/17 0627  AST 339* 224*  ALT 121* 81*  ALKPHOS 213* 162*  BILITOT 1.1 1.9*  PROT 7.6 6.0*  ALBUMIN 3.2* 2.6*   Recent Labs  Lab 11/09/17 2215  LIPASE 28   No results for input(s): AMMONIA in the last 168 hours. Coagulation Profile: Recent Labs  Lab 11/10/17 0239  INR 1.09  Cardiac Enzymes: No results for input(s): CKTOTAL, CKMB, CKMBINDEX, TROPONINI in the last 168 hours. BNP (last 3 results) No results for input(s): PROBNP in the last 8760 hours. HbA1C: No results for input(s): HGBA1C in the last 72 hours. CBG: Recent Labs  Lab 11/10/17 0846  GLUCAP 109*   Lipid Profile: No results for input(s): CHOL, HDL, LDLCALC, TRIG, CHOLHDL, LDLDIRECT in the last 72 hours. Thyroid Function Tests: Recent Labs    11/10/17 0239  TSH 4.066   Anemia Panel: No results for input(s): VITAMINB12, FOLATE, FERRITIN, TIBC, IRON, RETICCTPCT in the last 72 hours. Urine analysis:    Component Value Date/Time   COLORURINE YELLOW 07/18/2017 1214   APPEARANCEUR HAZY (A) 07/18/2017 1214   LABSPEC 1.021 07/18/2017 1214   PHURINE 5.0 07/18/2017 1214   GLUCOSEU  NEGATIVE 07/18/2017 1214   HGBUR SMALL (A) 07/18/2017 1214   BILIRUBINUR NEGATIVE 07/18/2017 1214   KETONESUR 80 (A) 07/18/2017 1214   PROTEINUR 100 (A) 07/18/2017 1214   NITRITE NEGATIVE 07/18/2017 1214   LEUKOCYTESUR NEGATIVE 07/18/2017 1214     Radiology Studies: Reviewed images personally in health database    Scheduled Meds: . cloNIDine  0.1 mg Oral TID  . diazepam  5 mg Oral Q6H  . enoxaparin (LOVENOX) injection  40 mg Subcutaneous Q24H  . FLUoxetine  20 mg Oral Daily  . multivitamin with minerals  1 tablet Oral Daily  . nicotine  21 mg Transdermal Daily  . pantoprazole (PROTONIX) IV  40 mg Intravenous Q12H  . potassium chloride  40 mEq Oral Once  . sucralfate  1 g Oral TID WC & HS  . thiamine  100 mg Oral Daily   Or  . thiamine  100 mg Intravenous Daily   Continuous Infusions: . 0.9 % sodium chloride with kcl 100 mL/hr at 11/11/17 0234     LOS: 1 day    Time spent: [place], MD [facility]   If 7PM-7AM, please contact night-coverage [email or web address] Password [email or web address] 11/11/2017, 8:14 AM

## 2017-11-11 NOTE — Progress Notes (Signed)
Report given to receiving RN. Pt tx to 5W. All questions answered

## 2017-11-11 NOTE — Progress Notes (Signed)
Pt wanting to leave AMA discussed the risk of leaving AMA.  Pt wants to see the Physician

## 2017-11-11 NOTE — Progress Notes (Signed)
Physician spoke to pt, pt agreed to stay one more day for observation.

## 2017-11-12 ENCOUNTER — Other Ambulatory Visit: Payer: Self-pay

## 2017-11-12 LAB — CBC WITH DIFFERENTIAL/PLATELET
BASOS ABS: 0 10*3/uL (ref 0.0–0.1)
Basophils Relative: 0 %
Eosinophils Absolute: 0.1 10*3/uL (ref 0.0–0.7)
Eosinophils Relative: 2 %
HEMATOCRIT: 33.9 % — AB (ref 36.0–46.0)
HEMOGLOBIN: 11 g/dL — AB (ref 12.0–15.0)
Lymphocytes Relative: 19 %
Lymphs Abs: 1.3 10*3/uL (ref 0.7–4.0)
MCH: 37.2 pg — ABNORMAL HIGH (ref 26.0–34.0)
MCHC: 32.4 g/dL (ref 30.0–36.0)
MCV: 114.5 fL — ABNORMAL HIGH (ref 78.0–100.0)
MONOS PCT: 5 %
Monocytes Absolute: 0.3 10*3/uL (ref 0.1–1.0)
NEUTROS PCT: 74 %
Neutro Abs: 5.1 10*3/uL (ref 1.7–7.7)
Platelets: 175 10*3/uL (ref 150–400)
RBC: 2.96 MIL/uL — AB (ref 3.87–5.11)
RDW: 13.6 % (ref 11.5–15.5)
WBC: 6.8 10*3/uL (ref 4.0–10.5)

## 2017-11-12 LAB — COMPREHENSIVE METABOLIC PANEL
ALT: 66 U/L — ABNORMAL HIGH (ref 14–54)
AST: 160 U/L — ABNORMAL HIGH (ref 15–41)
Albumin: 2.3 g/dL — ABNORMAL LOW (ref 3.5–5.0)
Alkaline Phosphatase: 149 U/L — ABNORMAL HIGH (ref 38–126)
Anion gap: 3 — ABNORMAL LOW (ref 5–15)
BUN: <5 mg/dL — ABNORMAL LOW (ref 6–20)
CHLORIDE: 110 mmol/L (ref 101–111)
CO2: 23 mmol/L (ref 22–32)
CREATININE: 0.45 mg/dL (ref 0.44–1.00)
Calcium: 8 mg/dL — ABNORMAL LOW (ref 8.9–10.3)
Glucose, Bld: 92 mg/dL (ref 65–99)
POTASSIUM: 5.1 mmol/L (ref 3.5–5.1)
Sodium: 136 mmol/L (ref 135–145)
TOTAL PROTEIN: 5.4 g/dL — AB (ref 6.5–8.1)
Total Bilirubin: 1.3 mg/dL — ABNORMAL HIGH (ref 0.3–1.2)

## 2017-11-12 LAB — GLUCOSE, CAPILLARY: Glucose-Capillary: 108 mg/dL — ABNORMAL HIGH (ref 65–99)

## 2017-11-12 MED ORDER — SUCRALFATE 1 GM/10ML PO SUSP: 1.0000 g | Freq: Three times a day (TID) | ORAL | 0 refills | Status: AC

## 2017-11-12 MED ORDER — PANTOPRAZOLE SODIUM 40 MG PO TBEC: 40.0000 mg | DELAYED_RELEASE_TABLET | Freq: Two times a day (BID) | ORAL | 0 refills | Status: AC

## 2017-11-12 MED ORDER — HYDROXYZINE HCL 10 MG PO TABS: 10.0000 mg | ORAL_TABLET | Freq: Three times a day (TID) | ORAL | 0 refills | Status: AC | PRN

## 2017-11-12 MED ORDER — CLONIDINE HCL 0.1 MG PO TABS: 0.1000 mg | ORAL_TABLET | Freq: Three times a day (TID) | ORAL | 11 refills | Status: AC

## 2017-11-12 NOTE — Discharge Summary (Signed)
Pt AVS discussed by Ginger, R(charge). Pt is alert and oriented x4 upon discharge. IV removed and skin intact. PT discharged via private vehicle.

## 2017-11-12 NOTE — Discharge Summary (Signed)
Physician Discharge Summary  Cynthia Schroeder FWY:[id number] DOB: July 14, 1966 DOA: 11/09/2017  PCP: Leeroy Cha, MD  Admit date: 11/09/2017 Discharge date: 11/12/2017  Time spent: 25 minutes  Recommendations for Outpatient Follow-up:  1. Recommend CBC as well as complete metabolic panel in about 1-2 weeks 2. Recommend complete cessation of alcohol 3. 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  HTN, Bipolar TIA,  reflux,  EtOH abuse with probable withdrawal seizures on admission 02/28/2017 Previously had been in 28-day program for alcohol cessation  Last admitted 04/2017 for withdrawal  Readmitted 11/10/2017 with alcohol withdrawal abdominal pain nausea vomiting Blood alcohol level 299 lipase 28 WBC 8.3 elevated LFTs AST/ALT 337/121 heart rate 150s CT abdomen pelvis no new findings    Hospital Course:  ETOH withdrawal ciwa eventually came down to the 2-4 range and she stabilized enough to go home LFT from transaminitis likely 2/2 ETOH resolved  N/v and probable etoh gastritis- Improved-eating soft diet Cont protonix 40 daily placed on carafate this admit 1 g tid and continue  tob abuse Cont nicotine patch  Sinus tach Likely 2/2 withdrawal Cont clonidone 1 mg started earlier in admit was rx for d/c home  Discharge Exam: Vitals:   11/12/17 0512 11/12/17 0912  BP: (!) 152/101 135/80  Pulse: 78   Resp: 18   Temp: 98.5 F (36.9 C)   SpO2: 94%     General: eomincat Cardiovascular: s1 s  2no m/r/g Respiratory: cta b abd soft nt nd no rebound no guard   Discharge Instructions   Discharge Instructions    Diet - low sodium heart healthy   Complete by:  As directed    Discharge instructions   Complete by:  As directed    Please fill the prescriptions that have been written few specifically the Protonix and the Carafate as this will help prevent nausea and other issues You should follow-up with your psychiatrist and seek alternatives to Prozac that you have not been taking in the past Would recommend labs in about 1 week at your regular physician office   Increase activity slowly   Complete by:  As directed      Current Discharge Medication List    START taking these medications   Details  cloNIDine (CATAPRES) 0.1 MG tablet Take 1 tablet (0.1 mg total) by mouth 3 (three) times daily. Qty: 60 tablet, Refills: 11    hydrOXYzine (ATARAX/VISTARIL) 10 MG tablet Take 1 tablet (10 mg total) by mouth 3 (three) times daily as needed for nausea. Qty: 30 tablet, Refills: 0    pantoprazole (PROTONIX) 40 MG tablet Take 1 tablet (40 mg total) by mouth 2 (two) times daily. Qty: 60 tablet, Refills: 0    sucralfate (CARAFATE) 1 GM/10ML suspension Take 10 mLs (1 g total) by mouth 4 (four) times daily -  with meals and at bedtime. Qty: 420 mL, Refills: 0      CONTINUE these medications which have NOT CHANGED   Details  Multiple Vitamins-Minerals (MULTI-VITAMIN GUMMIES) CHEW Chew  2 tablets daily by mouth.     famotidine (PEPCID) 20 MG tablet Take 1 tablet (20 mg total) by mouth 2 (two) times daily. Qty: 30 tablet, Refills: 0    nicotine (NICODERM CQ - DOSED IN MG/24 HOURS) 14 mg/24hr patch Place 1 patch (14 mg total) onto the skin daily. Qty: 28 patch, Refills: 0    ondansetron (ZOFRAN ODT) 4 MG disintegrating tablet Take 1 tablet (4 mg total) by mouth every 8 (eight) hours as needed for nausea or vomiting. Qty: 10 tablet, Refills: 0      STOP taking these medications      chlordiazePOXIDE (LIBRIUM) 25 MG capsule      FLUoxetine (PROZAC) 20 MG capsule        Allergies  Allergen Reactions  . Codeine Itching and Rash      The results of significant diagnostics from this hospitalization (including imaging, microbiology, ancillary and laboratory) are listed below for reference.    Significant Diagnostic Studies: Ct Abdomen Pelvis W Contrast  Result Date: 11/10/2017 CLINICAL DATA:  Mid central abdominal pain for 2 days. Nausea and vomiting. History of a drinking problem and wants detox. EXAM: CT ABDOMEN AND PELVIS WITH CONTRAST TECHNIQUE: Multidetector CT imaging of the abdomen and pelvis was performed using the standard protocol following bolus administration of intravenous contrast. CONTRAST:  142mL ISOVUE-300 IOPAMIDOL (ISOVUE-300) INJECTION 61% COMPARISON:  05/12/2017 FINDINGS: Lower chest: Atelectasis in the lung bases. Hepatobiliary: Prominent diffuse fatty infiltration of the liver. No focal lesions. Gallbladder and bile ducts are unremarkable. Pancreas: Unremarkable. No pancreatic ductal dilatation or surrounding inflammatory changes. Spleen: Normal in size without focal abnormality. Adrenals/Urinary Tract: Adrenal glands are unremarkable. Kidneys are normal, without renal calculi, focal lesion, or hydronephrosis. Bladder is unremarkable. Stomach/Bowel: Stomach is within normal limits. Appendix appears normal. No evidence of bowel wall thickening, distention, or inflammatory changes. Vascular/Lymphatic: Aortic atherosclerosis. No enlarged abdominal or pelvic lymph nodes. Reproductive: Uterus and bilateral adnexa are unremarkable. Other: No abdominal wall hernia or abnormality. No abdominopelvic ascites. Musculoskeletal: Degenerative changes in the spine. No destructive bone lesions. IMPRESSION: 1. No acute process demonstrated in the abdomen or pelvis. 2. Diffuse fatty infiltration of the liver. 3. Aortic atherosclerosis. Electronically Signed   By: Lucienne Capers M.D.   On: 11/10/2017 00:03    Microbiology: Recent Results (from the past 240 hour(s))  MRSA PCR Screening     Status: None   Collection Time: 11/11/17  2:01 AM  Result Value Ref Range Status   MRSA by PCR NEGATIVE NEGATIVE Final    Comment:        The GeneXpert MRSA Assay (FDA approved for NASAL specimens only), is one component of a comprehensive MRSA colonization surveillance program. It is not intended to diagnose MRSA infection nor to guide or monitor treatment for MRSA infections.      Labs: Basic Metabolic Panel: Recent Labs  Lab 11/09/17 1854 11/10/17 0239 11/11/17 0627 11/12/17 0645  NA 141 138 137 136  K 3.3* 3.0* 4.6 5.1  CL 98* 100* 109 110  CO2 26 24 23 23   GLUCOSE 147* 99 85 92  BUN <5* <5* <5* <5*  CREATININE 0.57 0.51 0.46 0.45  CALCIUM 8.9 7.9* 7.9* 8.0*  MG  --  1.5*  --   --    Liver Function Tests: Recent Labs  Lab 11/09/17 1854 11/11/17 0627 11/12/17 0645  AST 339* 224* 160*  ALT 121* 81* 66*  ALKPHOS 213* 162* 149*  BILITOT 1.1 1.9* 1.3*  PROT  7.6 6.0* 5.4*  ALBUMIN 3.2* 2.6* 2.3*   Recent Labs  Lab 11/09/17 2215  LIPASE 28   No results for input(s): AMMONIA in the last 168 hours. CBC: Recent Labs  Lab 11/09/17 1854 11/10/17 0239 11/12/17 0645  WBC 8.3 7.3 6.8  NEUTROABS  --   --  PENDING  HGB 14.9 12.9 11.0*  HCT 42.8 38.3 33.9*  MCV 108.9* 109.4* 114.5*  PLT 448* 313 175   Cardiac Enzymes: No results for input(s): CKTOTAL, CKMB, CKMBINDEX, TROPONINI in the last 168 hours. BNP: BNP (last 3 results) No results for input(s): BNP in the last 8760 hours.  ProBNP (last 3 results) No results for input(s): PROBNP in the last 8760 hours.  CBG: Recent Labs  Lab 11/10/17 0846 11/11/17 0728 11/12/17 0755  GLUCAP 109* 93 108*       Signed:  Nita Sells MD   [facility] 11/12/2017, 9:36 AM

## 2017-11-12 NOTE — Plan of Care (Signed)
Patient progressing 

## 2017-11-18 ENCOUNTER — Encounter: Payer: Self-pay | Admitting: Cardiovascular Disease

## 2018-02-20 DEATH — deceased

## 2018-03-19 ENCOUNTER — Other Ambulatory Visit: Payer: BLUE CROSS/BLUE SHIELD

## 2018-09-13 IMAGING — CT CT ABD-PELV W/ CM
2 of 5 series · 17 of 46 positions shown, 19 images · IV contrast (APPLIED)
Comparison: 05/12/2017

CLINICAL DATA: Mid central abdominal pain for 2 days. Nausea and
vomiting. History of a drinking problem and wants detox.

EXAM:
CT ABDOMEN AND PELVIS WITH CONTRAST
TECHNIQUE: Multidetector CT imaging of the abdomen and pelvis was performed
using the standard protocol following bolus administration of
intravenous contrast.
CONTRAST:  100mL TPA8KS-DRR IOPAMIDOL (TPA8KS-DRR) INJECTION 61%

[Series 3: abdomen 5.0 · axial · 0.78mm/px · z∈[+634,+1038]mm · 14 of 93 slices shown, 16 images]
[im 6/93  soft-tissue]
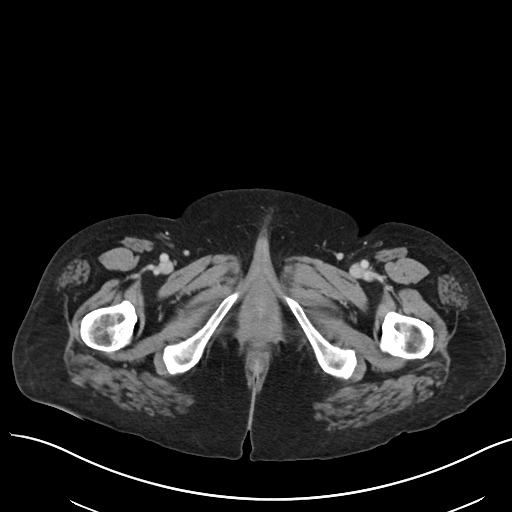
[im 6/93  bone]
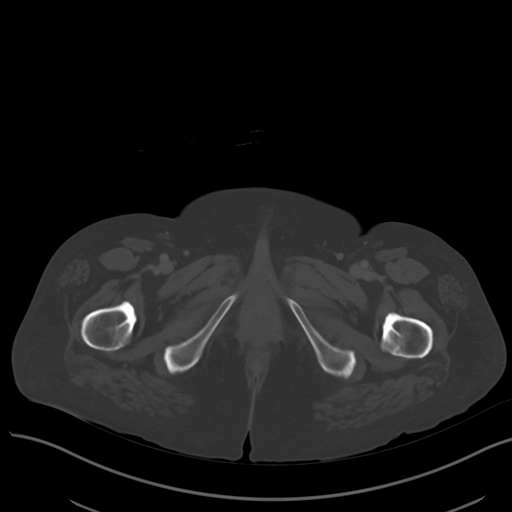
[im 11/93  soft-tissue]
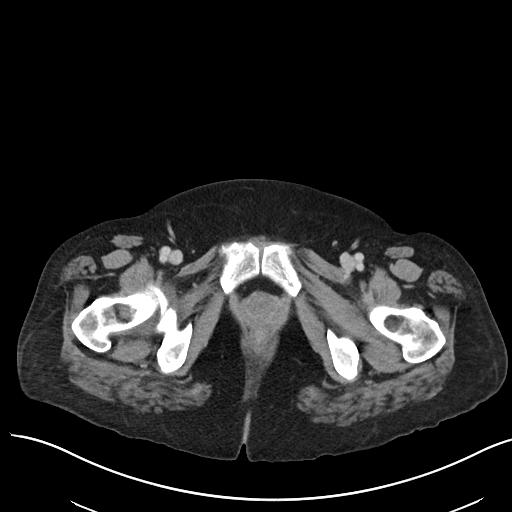
[im 17/93  soft-tissue]
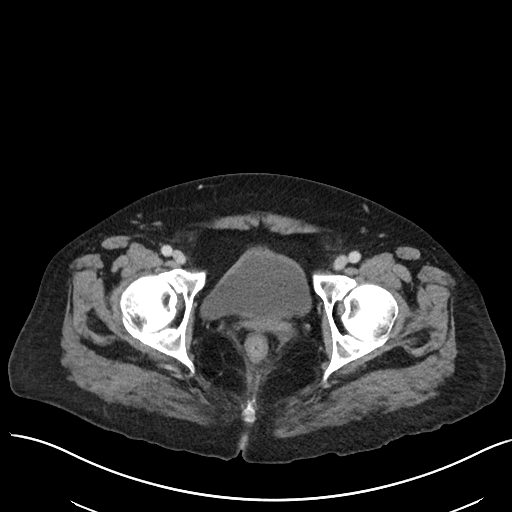
[im 28/93  soft-tissue]
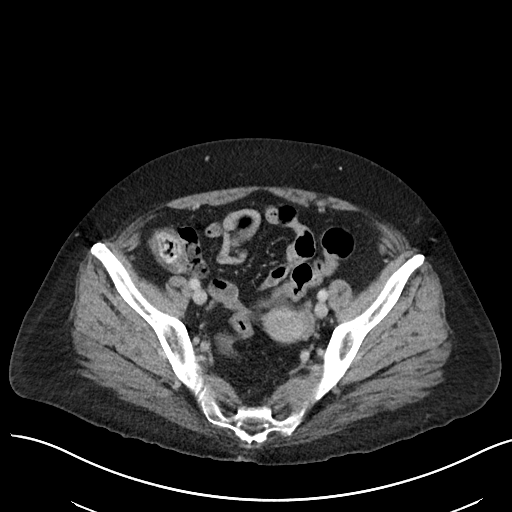
[im 33/93  soft-tissue]
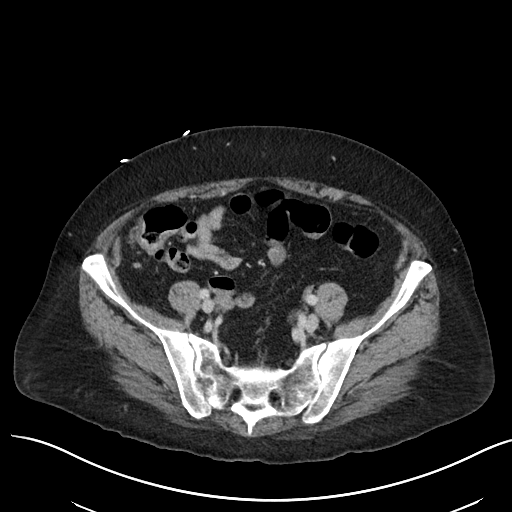
[im 38/93  soft-tissue]
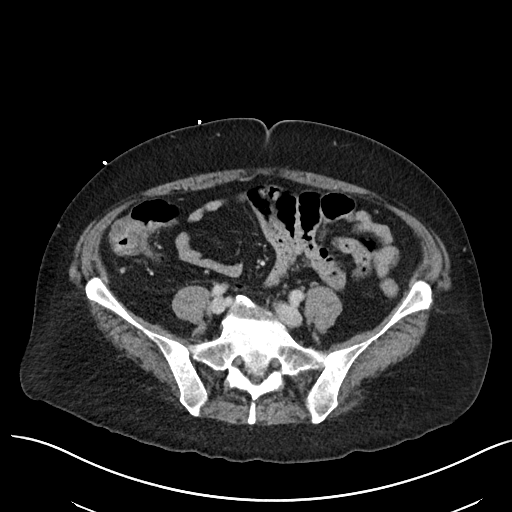
[im 44/93  soft-tissue]
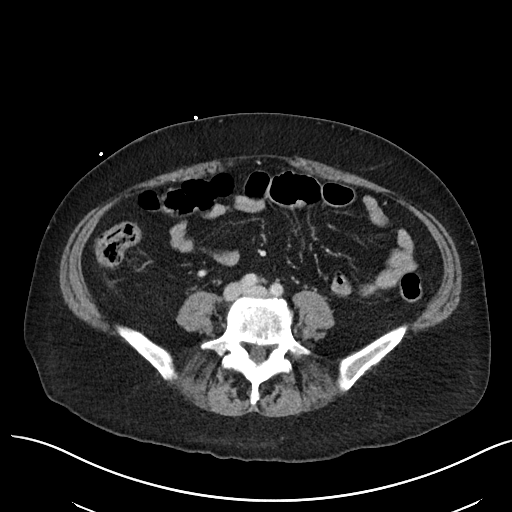
[im 49/93  soft-tissue]
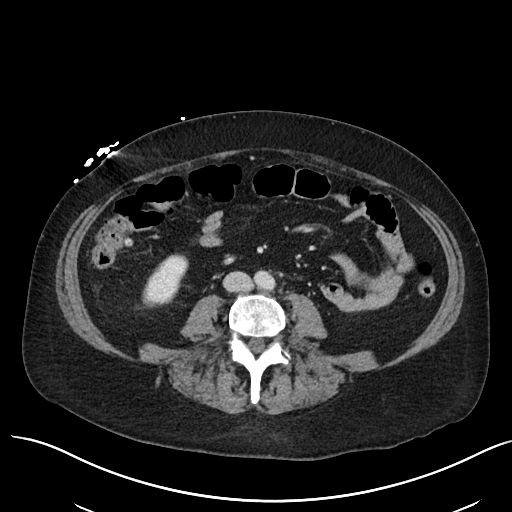
[im 55/93  soft-tissue]
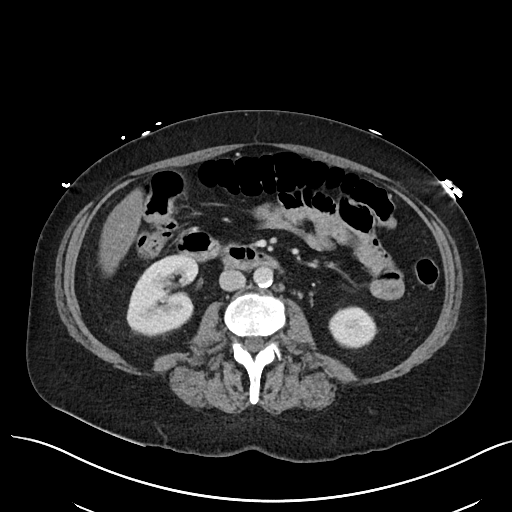
[im 55/93  bone]
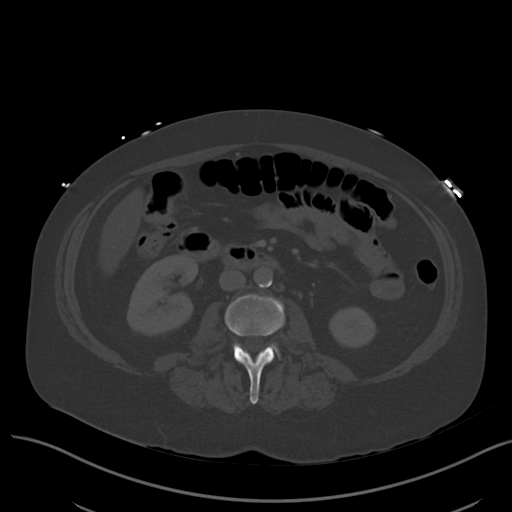
[im 60/93  soft-tissue]
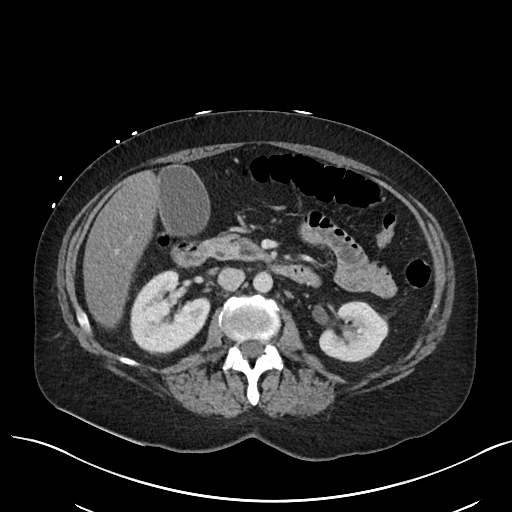
[im 71/93  soft-tissue]
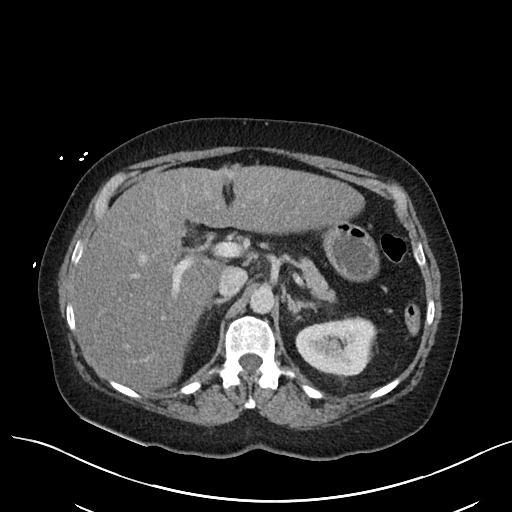
[im 76/93  soft-tissue]
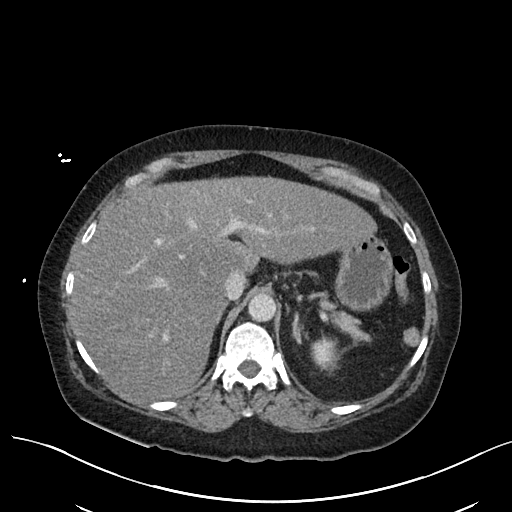
[im 82/93  soft-tissue]
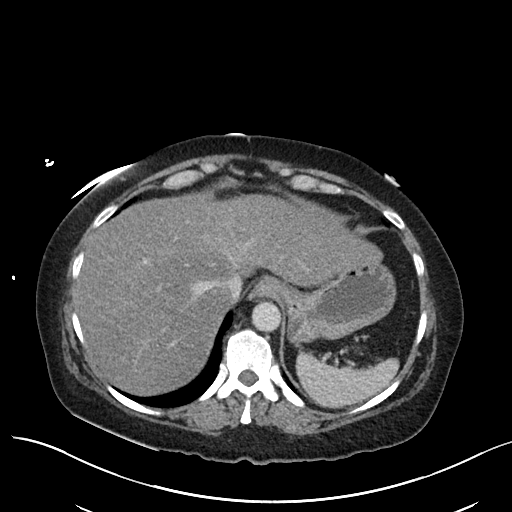
[im 87/93  soft-tissue]
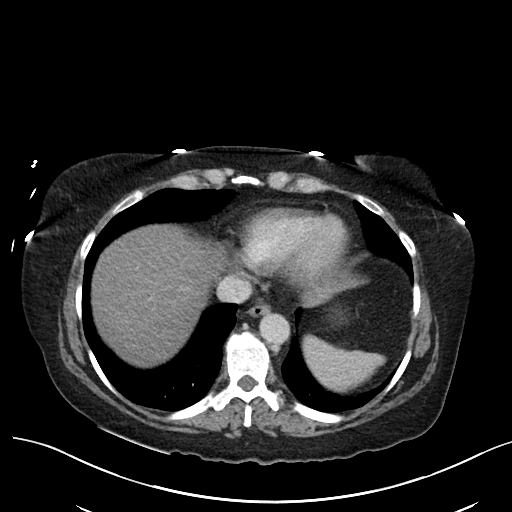

[Series 6: abdomen 3.0 mpr cor · coronal · 0.83mm/px · 3 of 98 slices shown]
[im 33/98  soft-tissue]
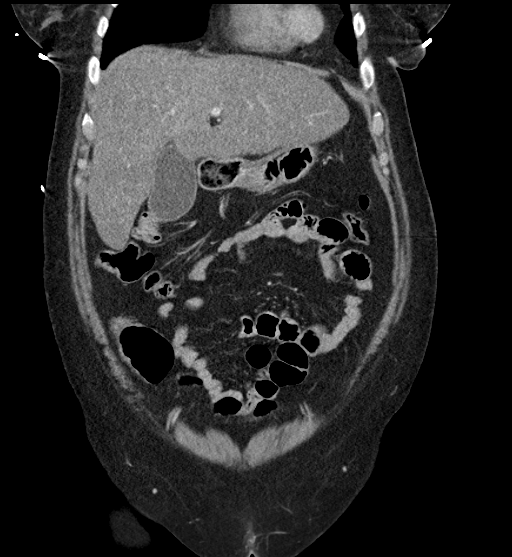
[im 44/98  soft-tissue]
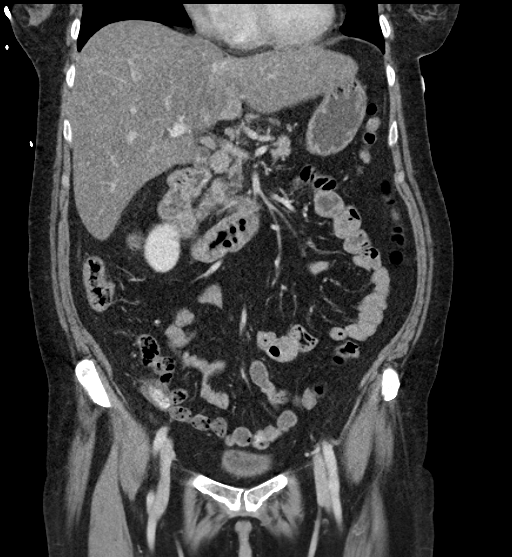
[im 54/98  soft-tissue]
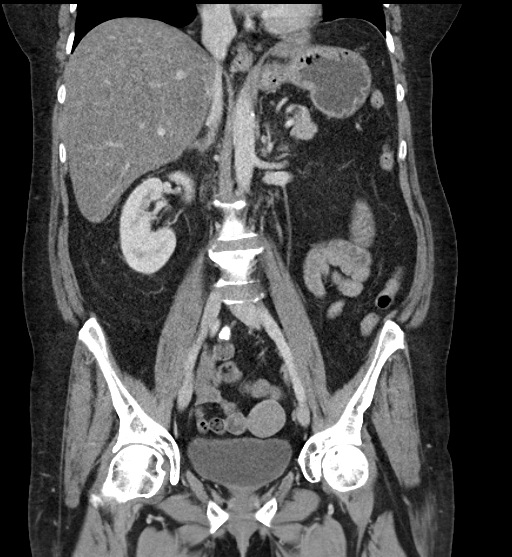

[17 of 46 positions shown; findings below may reference images not displayed]

FINDINGS: Lower chest: Atelectasis in the lung bases.

Hepatobiliary: Prominent diffuse fatty infiltration of the liver. No
focal lesions. Gallbladder and bile ducts are unremarkable.

Pancreas: Unremarkable. No pancreatic ductal dilatation or
surrounding inflammatory changes.

Spleen: Normal in size without focal abnormality.

Adrenals/Urinary Tract: Adrenal glands are unremarkable. Kidneys are
normal, without renal calculi, focal lesion, or hydronephrosis.
Bladder is unremarkable.

Stomach/Bowel: Stomach is within normal limits. Appendix appears
normal. No evidence of bowel wall thickening, distention, or
inflammatory changes.

Vascular/Lymphatic: Aortic atherosclerosis. No enlarged abdominal or
pelvic lymph nodes.

Reproductive: Uterus and bilateral adnexa are unremarkable.

Other: No abdominal wall hernia or abnormality. No abdominopelvic
ascites.

Musculoskeletal: Degenerative changes in the spine. No destructive
bone lesions.
IMPRESSION: 1. No acute process demonstrated in the abdomen or pelvis.
2. Diffuse fatty infiltration of the liver.
3. Aortic atherosclerosis.
